# Patient Record
Sex: Male | Born: 1980 | Race: Black or African American | Hispanic: No | Marital: Single | Smoking: Former smoker
Health system: Southern US, Community
[De-identification: ages and names within clinical notes are randomized; demographics above are authoritative.]

---

## 2005-12-13 ENCOUNTER — Emergency Department (HOSPITAL_COMMUNITY): Admission: EM | Admit: 2005-12-13 | Discharge: 2005-12-13 | Payer: Self-pay | Admitting: Family Medicine

## 2006-01-07 ENCOUNTER — Emergency Department (HOSPITAL_COMMUNITY): Admission: EM | Admit: 2006-01-07 | Discharge: 2006-01-07 | Payer: Self-pay | Admitting: Family Medicine

## 2013-10-17 ENCOUNTER — Emergency Department (HOSPITAL_COMMUNITY)
Admission: EM | Admit: 2013-10-17 | Discharge: 2013-10-18 | Disposition: A | Discharge status: Home / Self Care | Attending: Emergency Medicine | Admitting: Emergency Medicine

## 2013-10-17 ENCOUNTER — Encounter (HOSPITAL_COMMUNITY): Payer: Self-pay | Admitting: Emergency Medicine

## 2013-10-17 DIAGNOSIS — R1013 Epigastric pain: Secondary | ICD-10-CM | POA: Insufficient documentation

## 2013-10-17 DIAGNOSIS — T7840XA Allergy, unspecified, initial encounter: Secondary | ICD-10-CM

## 2013-10-17 DIAGNOSIS — T61771A Other fish poisoning, accidental (unintentional), initial encounter: Secondary | ICD-10-CM | POA: Insufficient documentation

## 2013-10-17 DIAGNOSIS — Y9389 Activity, other specified: Secondary | ICD-10-CM | POA: Insufficient documentation

## 2013-10-17 DIAGNOSIS — Y929 Unspecified place or not applicable: Secondary | ICD-10-CM | POA: Insufficient documentation

## 2013-10-17 DIAGNOSIS — L272 Dermatitis due to ingested food: Secondary | ICD-10-CM | POA: Insufficient documentation

## 2013-10-17 DIAGNOSIS — R112 Nausea with vomiting, unspecified: Secondary | ICD-10-CM | POA: Insufficient documentation

## 2013-10-17 MED ORDER — METHYLPREDNISOLONE SODIUM SUCC 125 MG IJ SOLR
125.0000 mg | Freq: Once | INTRAMUSCULAR | Status: AC
  Administered 2013-10-17: 125 mg via INTRAVENOUS
  Filled 2013-10-17: qty 2

## 2013-10-17 NOTE — ED Notes (Signed)
States at present has some epigastric tenderness from vomiting prior to arrival

## 2013-10-17 NOTE — ED Notes (Signed)
Here with two attendants from [facility].  Patient has shackles on feet.   No urticaria noted at this time, no difficulty breathing.

## 2013-10-17 NOTE — ED Notes (Addendum)
States ate some food that another offered to him - was not aware it has oysters in it and he is allergic to shellfish.  EMS reports patient c/o urticaria but they did not see.  Also c/o feeling tightness in chest, given tagamet en route, patient also vomited and given zofran  Patient took Benadryl 50mg  po prior to EMS arrival - states the hives were under his arms at that time- none present now

## 2013-10-17 NOTE — ED Provider Notes (Signed)
CSN: [id number]     Arrival date & time 10/17/13  2249 History  This chart was scribed for Jonathon Nielsen, MD by Quintella Reichert, ED scribe.  This patient was seen in room APA14/APA14 and the patient's care was started at 11:08 PM.   Chief Complaint  Patient presents with  . Allergic Reaction    Patient is a 32 y.o. male presenting with allergic reaction. The history is provided by the patient. No language interpreter was used.  Allergic Reaction Presenting symptoms: difficulty breathing (throat tightness) and itching   Presenting symptoms comment:  Nausea, vomiting Severity:  Moderate Prior allergic episodes:  Animal allergies (shellfish) Context: food   Relieved by:  Antihistamines   HPI Comments: Jonathon Beasley is a 32 y.o. male who presents to the Emergency Department complaining of an allergic reaction.  Pt states that he "ate some fish I shouldn't have ate" tonight at around 5:30 PM and 3 hours later he developed mild-to-moderate throat tightness and generalized itching. PT states known allergy to shellfish and believes he consumed oysters. 2 hours ago he became nauseated and vomited.  He took 50mg  Benadryl PTA and was given IV Zantac and Zofran by EMS en route and he states his symptoms have since resolved.  He now denies any SOB or tongue swelling.  Pt admits to prior h/o similar reaction 3 days ago which resolved with Benadryl and he was not seen for this.  He denies prior similar episodes before that time.  Pt denies any other chronic medical conditions or regular medication usage.  EMS did report itching and hives - resolved in route     History reviewed. No pertinent past medical history.  History reviewed. No pertinent past surgical history.  No family history on file.   History  Substance Use Topics  . Smoking status: Never Smoker   . Smokeless tobacco: Not on file  . Alcohol Use: No    Review of Systems  Skin: Positive for itching.  All other systems reviewed and  are negative.      Allergies  Review of patient's allergies indicates no known allergies.  Home Medications  No current outpatient prescriptions on file.  BP 143/79  Pulse 85  Temp(Src) 98.9 F (37.2 C) (Oral)  Ht 6\' 1"  (1.854 m)  Wt 275 lb (124.739 kg)  BMI 36.29 kg/m2  SpO2 94%  Physical Exam  Nursing note and vitals reviewed. Constitutional: He is oriented to person, place, and time. He appears well-developed and well-nourished.  HENT:  Head: Normocephalic and atraumatic.  Mouth/Throat: Uvula is midline.  Eyes: EOM are normal.  Neck: Normal range of motion.  Cardiovascular: Normal rate, regular rhythm, normal heart sounds and intact distal pulses.   Pulmonary/Chest: Effort normal and breath sounds normal. No respiratory distress. He has no wheezes. He has no rales.  Abdominal: Soft. He exhibits no distension. There is tenderness (mild epigastric tenderness). There is negative Murphy's sign.  Musculoskeletal: Normal range of motion.  Neurological: He is alert and oriented to person, place, and time.  Skin: Skin is warm and dry. No rash noted.  Psychiatric: He has a normal mood and affect. Judgment normal.    ED Course  Procedures (including critical care time)  DIAGNOSTIC STUDIES: Oxygen Saturation is 94% on room air, adequate by my interpretation.    COORDINATION OF CARE: 11:13 PM: Discussed treatment plan which includes Solu-Medrol and labs.  Pt expressed understanding and agreed to plan.   Labs Review Labs Reviewed  CBC - Abnormal;  Notable for the following:    WBC 11.0 (*)    All other components within normal limits  COMPREHENSIVE METABOLIC PANEL  LIPASE, BLOOD   IV solumedrol  1:11 AM symptoms resolved, no acute ABD on exam: s/nt/nd.  Tolerating PO fluids.   Plan discharge back to facility with Rx prednisone, zantac and also benadryl as needed. Return precautions provided.  Stable for discharge at this time.   MDM  Dx: Allergic reaction, no  anaphylaxis  Labs reviewed Improved with medications Serial exams and evaluations VS and nurses notes reviewed and considered   I personally performed the services described in this documentation, which was scribed in my presence. The recorded information has been reviewed and is accurate.     Jonathon Nielsen, MD 10/18/13 [phone]

## 2013-10-18 LAB — COMPREHENSIVE METABOLIC PANEL
ALT: 18 U/L (ref 0–53)
Alkaline Phosphatase: 78 U/L (ref 39–117)
BUN: 8 mg/dL (ref 6–23)
CO2: 25 mEq/L (ref 19–32)
Chloride: 101 mEq/L (ref 96–112)
GFR calc Af Amer: >90 mL/min (ref >90)
Glucose, Bld: 99 mg/dL (ref 70–99)
Potassium: 3.6 mEq/L (ref 3.5–5.1)
Total Bilirubin: 0.4 mg/dL (ref 0.3–1.2)

## 2013-10-18 LAB — CBC
HCT: 40 % (ref 39.0–52.0)
Hemoglobin: 13 g/dL (ref 13.0–17.0)
RBC: 4.56 MIL/uL (ref 4.22–5.81)
WBC: 11 10*3/uL — ABNORMAL HIGH (ref 4.0–10.5)

## 2013-10-18 LAB — LIPASE, BLOOD: Lipase: 19 U/L (ref 11–59)

## 2013-10-18 MED ORDER — RANITIDINE HCL 150 MG PO CAPS: 150.0000 mg | ORAL_CAPSULE | Freq: Every day | ORAL | Status: DC

## 2013-10-18 MED ORDER — ONDANSETRON HCL 4 MG PO TABS: 4.0000 mg | ORAL_TABLET | Freq: Four times a day (QID) | ORAL | Status: DC

## 2013-10-18 MED ORDER — PREDNISONE 20 MG PO TABS: 60.0000 mg | ORAL_TABLET | Freq: Every day | ORAL | Status: DC

## 2013-10-18 NOTE — ED Notes (Signed)
Offered fluids - chose Sprite - drinking now.

## 2013-10-18 NOTE — ED Notes (Addendum)
Tolerated fluids without emesis or vomiting.  No urticaria.  No Wheezing and no respiratory distress

## 2014-02-03 ENCOUNTER — Ambulatory Visit (INDEPENDENT_AMBULATORY_CARE_PROVIDER_SITE_OTHER): Payer: BC Managed Care – PPO | Admitting: Sports Medicine

## 2014-02-03 ENCOUNTER — Encounter: Payer: Self-pay | Admitting: Sports Medicine

## 2014-02-03 VITALS — BP 127/80 | HR 81 | Ht 73.0 in | Wt 290.0 lb

## 2014-02-03 DIAGNOSIS — M25561 Pain in right knee: Secondary | ICD-10-CM | POA: Insufficient documentation

## 2014-02-03 DIAGNOSIS — M25569 Pain in unspecified knee: Secondary | ICD-10-CM

## 2014-02-03 MED ORDER — MELOXICAM 15 MG PO TABS: ORAL_TABLET | ORAL | Status: DC

## 2014-02-03 NOTE — Assessment & Plan Note (Signed)
This occurred after a slip and fall in the ice about a week ago, he had immediate pain, swelling, predominately along the medial joint line. Now he gets some clicking, I do suspect this is related to either medial meniscal tear versus patellofemoral excision. Knee brace, Mobic, formal physical therapy, out of work for one week.

## 2014-02-03 NOTE — Progress Notes (Signed)
   Subjective:    I'm seeing this patient as a consultation for:  Lindie SpruceBarnes, Daniel G Jr., MD   CC: Right knee pain  HPI: This is a pleasant 33 year old male, approximately 6 days ago he slipped and fell on the ice, he felt his knee cap popped, and had immediate pain and swelling, he was [facility] where x-rays showed patellofemoral DJD. He was placed in a knee immobilizer, given some oral medications, and told that he had a patellar dislocation. Since then he's noted occasional popping, under the patella but tells me the majority of his pain is along the medial joint line. It is difficult for him to bear weight at work.  Past medical history, Surgical history, Family history not pertinant except as noted below, Social history, Allergies, and medications have been entered into the medical record, reviewed, and no changes needed.   Review of Systems: No headache, visual changes, nausea, vomiting, diarrhea, constipation, dizziness, abdominal pain, skin rash, fevers, chills, night sweats, weight loss, swollen lymph nodes, body aches, joint swelling, muscle aches, chest pain, shortness of breath, mood changes, visual or auditory hallucinations.   Objective:   General: Well Developed, well nourished, and in no acute distress.  Neuro/Psych: Alert and oriented x3, extra-ocular muscles intact, able to move all 4 extremities, sensation grossly intact. Skin: Warm and dry, no rashes noted.  Respiratory: Not using accessory muscles, speaking in full sentences, trachea midline.  Cardiovascular: Pulses palpable, no extremity edema. Abdomen: Does not appear distended. Right Knee: Normal to inspection with no erythema or effusion or obvious bony abnormalities. Tender to palpation along the medial joint line, negative patellar apprehension sign. ROM full in flexion and extension and lower leg rotation. Ligaments with solid consistent endpoints including ACL,  PCL, LCL, MCL. Negative Mcmurray's, Apley's, and Thessalonian tests. Non painful patellar compression. Patellar glide without crepitus. Patellar and quadriceps tendons unremarkable. Hamstring and quadriceps strength is normal.  He was able to reproduce a pop, a did feel somewhat similar to a subluxing patella. I was not able to reproduce this manually.  Impression and Recommendations:   This case required medical decision making of moderate complexity.

## 2014-02-07 ENCOUNTER — Ambulatory Visit: Payer: BC Managed Care – PPO | Admitting: Physical Therapy

## 2014-02-09 ENCOUNTER — Ambulatory Visit: Payer: BC Managed Care – PPO | Admitting: Physical Therapy

## 2014-03-01 ENCOUNTER — Ambulatory Visit (INDEPENDENT_AMBULATORY_CARE_PROVIDER_SITE_OTHER): Payer: BC Managed Care – PPO

## 2014-03-01 ENCOUNTER — Encounter: Payer: Self-pay | Admitting: Sports Medicine

## 2014-03-01 ENCOUNTER — Ambulatory Visit (INDEPENDENT_AMBULATORY_CARE_PROVIDER_SITE_OTHER): Payer: BC Managed Care – PPO | Admitting: Sports Medicine

## 2014-03-01 VITALS — BP 141/84 | HR 89 | Ht 73.0 in | Wt 290.0 lb

## 2014-03-01 DIAGNOSIS — M25561 Pain in right knee: Secondary | ICD-10-CM

## 2014-03-01 DIAGNOSIS — M25569 Pain in unspecified knee: Secondary | ICD-10-CM

## 2014-03-01 DIAGNOSIS — M25559 Pain in unspecified hip: Secondary | ICD-10-CM

## 2014-03-01 DIAGNOSIS — R1031 Right lower quadrant pain: Secondary | ICD-10-CM

## 2014-03-01 DIAGNOSIS — Z299 Encounter for prophylactic measures, unspecified: Secondary | ICD-10-CM | POA: Insufficient documentation

## 2014-03-01 NOTE — Assessment & Plan Note (Signed)
Initial suspicion was patellar subluxation, versus medial meniscal tear. Now that his pain is 50% better, I am able to get a better exam, and unfortunately he does have a positive Lachman's test. We are going to obtain an MRI of the right knee, he come back to go over the results.

## 2014-03-01 NOTE — Assessment & Plan Note (Signed)
Patient does desire to establish care with me, he will come back for a complete physical at a later date.

## 2014-03-01 NOTE — Progress Notes (Signed)
  Subjective:    CC: Follow up and establish care  HPI: Right knee pain: 50% improved since the last month with relative rest. Unfortunately continues to have pain, instability, without mechanical symptoms.  Right groin pain: Moderate, persistent, present for a month, no injuries, worse with hip flexion.  Past medical history, Surgical history, Family history not pertinant except as noted below, Social history, Allergies, and medications have been entered into the medical record, reviewed, and no changes needed.   Review of Systems: No fevers, chills, night sweats, weight loss, chest pain, or shortness of breath.   Objective:    General: Well Developed, well nourished, and in no acute distress.  Neuro: Alert and oriented x3, extra-ocular muscles intact, sensation grossly intact.  HEENT: Normocephalic, atraumatic, pupils equal round reactive to light, neck supple, no masses, no lymphadenopathy, thyroid nonpalpable.  Skin: Warm and dry, no rashes. Cardiac: Regular rate and rhythm, no murmurs rubs or gallops, no lower extremity edema.  Respiratory: Clear to auscultation bilaterally. Not using accessory muscles, speaking in full sentences. Right Knee: Normal to inspection with no erythema or effusion or obvious bony abnormalities. Palpation normal with no warmth, joint line tenderness, patellar tenderness, or condyle tenderness. ROM full in flexion and extension and lower leg rotation. Anterior cruciate ligament feels loose with a positive Lachman's test. Negative Mcmurray's, Apley's, and Thessalonian tests. Non painful patellar compression. Patellar glide without crepitus. Patellar and quadriceps tendons unremarkable. Hamstring and quadriceps strength is normal.  Right Hip: ROM IR: 45 Deg, ER: 45 Deg, Flexion: 120 Deg, Extension: 100 Deg, Abduction: 45 Deg, Adduction: 45 Deg Strength IR: 5/5, ER: 5/5, Flexion: 5/5, Extension: 5/5, Abduction: 5/5, Adduction: 5/5 Pelvic alignment  unremarkable to inspection and palpation. Standing hip rotation and gait without trendelenburg sign / unsteadiness. Greater trochanter without tenderness to palpation. No tenderness over piriformis. No pain with FABER or FADIR. No SI joint tenderness and normal minimal SI movement. There is reproduction of pain with internal rotation of the right hip, as well as with resisted right hip flexion.  X-rays are unremarkable.  Impression and Recommendations:

## 2014-03-01 NOTE — Assessment & Plan Note (Signed)
Clinically this seems to represent a hip flexor strain. There are no mechanical symptoms to suggest an internal snapping hip. Certainly with his size, and again this could represent early osteoarthritis. Right hip x-rays, continue NSAIDs, hip rehabilitation exercises. Return in one month, MR arthrogram if no better.

## 2014-03-10 ENCOUNTER — Telehealth: Payer: Self-pay | Admitting: *Deleted

## 2014-03-10 NOTE — Telephone Encounter (Signed)
PA obtained for MRI RT Knee w/o contrast. Auth # [id number]. Exp. 04/08/14.  Meyer CoryMisty Ahmad, LPN

## 2014-03-23 ENCOUNTER — Ambulatory Visit (INDEPENDENT_AMBULATORY_CARE_PROVIDER_SITE_OTHER): Payer: BC Managed Care – PPO

## 2014-03-23 DIAGNOSIS — W19XXXA Unspecified fall, initial encounter: Secondary | ICD-10-CM

## 2014-03-23 DIAGNOSIS — M25561 Pain in right knee: Secondary | ICD-10-CM

## 2014-03-23 DIAGNOSIS — S83289A Other tear of lateral meniscus, current injury, unspecified knee, initial encounter: Secondary | ICD-10-CM

## 2014-03-28 ENCOUNTER — Encounter: Payer: BC Managed Care – PPO | Admitting: Sports Medicine

## 2014-03-28 DIAGNOSIS — Z0289 Encounter for other administrative examinations: Secondary | ICD-10-CM

## 2014-04-14 ENCOUNTER — Telehealth: Payer: Self-pay | Admitting: *Deleted

## 2014-04-14 ENCOUNTER — Ambulatory Visit (INDEPENDENT_AMBULATORY_CARE_PROVIDER_SITE_OTHER): Payer: BC Managed Care – PPO | Admitting: Sports Medicine

## 2014-04-14 ENCOUNTER — Encounter: Payer: Self-pay | Admitting: Sports Medicine

## 2014-04-14 ENCOUNTER — Other Ambulatory Visit: Payer: BC Managed Care – PPO

## 2014-04-14 VITALS — BP 139/90 | HR 64 | Ht 73.0 in | Wt 288.0 lb

## 2014-04-14 DIAGNOSIS — Z299 Encounter for prophylactic measures, unspecified: Secondary | ICD-10-CM

## 2014-04-14 DIAGNOSIS — M25561 Pain in right knee: Secondary | ICD-10-CM

## 2014-04-14 DIAGNOSIS — R1031 Right lower quadrant pain: Secondary | ICD-10-CM

## 2014-04-14 DIAGNOSIS — M25569 Pain in unspecified knee: Secondary | ICD-10-CM

## 2014-04-14 MED ORDER — MELOXICAM 15 MG PO TABS: ORAL_TABLET | ORAL | Status: DC

## 2014-04-14 NOTE — Progress Notes (Signed)
  Subjective:    CC: Complete physical  HPI:  Preventive measure: Complete physical performed today, blood work will be ordered.  Right-sided groin pain: Initially suspected to be a hip flexor strain, continued to have pain but he localizes in the groin radiating to the scrotum. It's worse with straining and lifting heavy things. No changes in bowel function.  Right knee pain: MRI did confirm multiple injuries to the menisci, pain is essentially resolved right now.  Past medical history, Surgical history, Family history not pertinant except as noted below, Social history, Allergies, and medications have been entered into the medical record, reviewed, and no changes needed.   Review of Systems: No headache, visual changes, nausea, vomiting, diarrhea, constipation, dizziness, abdominal pain, skin rash, fevers, chills, night sweats, swollen lymph nodes, weight loss, chest pain, body aches, joint swelling, muscle aches, shortness of breath, mood changes, visual or auditory hallucinations.  Objective:    General: Well Developed, well nourished, and in no acute distress.  Neuro: Alert and oriented x3, extra-ocular muscles intact, sensation grossly intact.  HEENT: Normocephalic, atraumatic, pupils equal round reactive to light, neck supple, no masses, no lymphadenopathy, thyroid nonpalpable.  Skin: Warm and dry, no rashes noted.  Cardiac: Regular rate and rhythm, no murmurs rubs or gallops.  Respiratory: Clear to auscultation bilaterally. Not using accessory muscles, speaking in full sentences.  Abdominal: Soft, nontender, nondistended, positive bowel sounds, no masses, no organomegaly.  Musculoskeletal: Shoulder, elbow, wrist, hip, knee, ankle stable, and with full range of motion. No reproduction of hip/groin pain with resisted hip flexion on the right. Genitalia: Normal in appearance, no discharge, no testicular masses, no palpable protrusions with Valsalva in the external or internal inguinal  ring, he does have a palpable hernia with Valsalva on the anterior lower abdominal wall on the right.  Impression and Recommendations:    The patient was counselled, risk factors were discussed, anticipatory guidance given.

## 2014-04-14 NOTE — Assessment & Plan Note (Signed)
MRI showed fairly extensive meniscal tears however his pain is essentially resolved. We will keep an eye on this.

## 2014-04-14 NOTE — Assessment & Plan Note (Signed)
Complete physical today. Checking routine blood work.

## 2014-04-14 NOTE — Assessment & Plan Note (Signed)
Initially thought to represent a hip flexor strain. Today on exam I am able to palpate what feels like a direct inguinal hernia that reproduces his pain. CT of the abdomen and pelvis with oral and IV contrast, and referral to Gen. surgery for consideration of repair.

## 2014-04-14 NOTE — Telephone Encounter (Signed)
PA obtained for CT ABD/Pelvis w/contrast.  Auth # [id number].  Meyer CoryMisty Othel Hoogendoorn, LPN

## 2014-04-15 ENCOUNTER — Ambulatory Visit (INDEPENDENT_AMBULATORY_CARE_PROVIDER_SITE_OTHER): Payer: BC Managed Care – PPO

## 2014-04-15 DIAGNOSIS — R1031 Right lower quadrant pain: Secondary | ICD-10-CM

## 2014-04-15 LAB — COMPREHENSIVE METABOLIC PANEL WITH GFR
ALT: 23 U/L (ref 0–53)
CO2: 23 meq/L (ref 19–32)
Calcium: 9.5 mg/dL (ref 8.4–10.5)
Chloride: 104 meq/L (ref 96–112)
Creat: 1.04 mg/dL (ref 0.50–1.35)
Glucose, Bld: 85 mg/dL (ref 70–99)

## 2014-04-15 LAB — LIPID PANEL
Cholesterol: 182 mg/dL (ref 0–200)
HDL: 63 mg/dL (ref >39)
LDL Cholesterol: 100 mg/dL — ABNORMAL HIGH (ref 0–99)
Total CHOL/HDL Ratio: 2.9 Ratio
Triglycerides: 94 mg/dL (ref <150)
VLDL: 19 mg/dL (ref 0–40)

## 2014-04-15 LAB — TSH: TSH: 0.619 u[IU]/mL (ref 0.350–4.500)

## 2014-04-15 LAB — CBC
HCT: 41.4 % (ref 39.0–52.0)
Hemoglobin: 13.8 g/dL (ref 13.0–17.0)
MCH: 28.5 pg (ref 26.0–34.0)
MCHC: 33.3 g/dL (ref 30.0–36.0)
MCV: 85.5 fL (ref 78.0–100.0)
Platelets: 200 K/uL (ref 150–400)
RBC: 4.84 MIL/uL (ref 4.22–5.81)
RDW: 13.3 % (ref 11.5–15.5)
WBC: 5.6 K/uL (ref 4.0–10.5)

## 2014-04-15 LAB — COMPREHENSIVE METABOLIC PANEL
AST: 20 U/L (ref 0–37)
Albumin: 4.3 g/dL (ref 3.5–5.2)
Alkaline Phosphatase: 73 U/L (ref 39–117)
BUN: 9 mg/dL (ref 6–23)
Potassium: 4.2 mEq/L (ref 3.5–5.3)
Sodium: 137 mEq/L (ref 135–145)
Total Bilirubin: 0.5 mg/dL (ref 0.2–1.2)
Total Protein: 7.3 g/dL (ref 6.0–8.3)

## 2014-04-15 LAB — HEMOGLOBIN A1C
Hgb A1c MFr Bld: 5.9 % — ABNORMAL HIGH (ref <5.7)
Mean Plasma Glucose: 123 mg/dL — ABNORMAL HIGH (ref <117)

## 2014-04-15 MED ORDER — IOHEXOL 300 MG/ML  SOLN
100.0000 mL | Freq: Once | INTRAMUSCULAR | Status: AC | PRN
  Administered 2014-04-15: 100 mL via INTRAVENOUS

## 2014-04-22 ENCOUNTER — Telehealth: Payer: Self-pay

## 2014-04-22 MED ORDER — TRAMADOL HCL 50 MG PO TABS: ORAL_TABLET | ORAL | Status: DC

## 2014-04-22 NOTE — Telephone Encounter (Signed)
Patient advised.

## 2014-04-22 NOTE — Telephone Encounter (Signed)
Switching to tramadol, did he ever see the surgeon? Prescription is in my box.

## 2014-04-22 NOTE — Telephone Encounter (Signed)
Jonathon Beasley complains he is still has groin and leg pain. The meloxicam is not helping with the pain. He reports his pain level is still at 8/10. Please advise.

## 2014-04-29 ENCOUNTER — Ambulatory Visit (INDEPENDENT_AMBULATORY_CARE_PROVIDER_SITE_OTHER): Payer: BC Managed Care – PPO | Admitting: Surgery

## 2014-07-22 ENCOUNTER — Ambulatory Visit: Payer: BC Managed Care – PPO | Admitting: Sports Medicine

## 2014-07-22 DIAGNOSIS — Z0289 Encounter for other administrative examinations: Secondary | ICD-10-CM

## 2019-05-19 ENCOUNTER — Encounter: Payer: Self-pay | Admitting: Sports Medicine

## 2019-05-19 ENCOUNTER — Ambulatory Visit (INDEPENDENT_AMBULATORY_CARE_PROVIDER_SITE_OTHER): Payer: Self-pay | Admitting: Sports Medicine

## 2019-05-19 DIAGNOSIS — S83005S Unspecified dislocation of left patella, sequela: Secondary | ICD-10-CM

## 2019-05-19 DIAGNOSIS — S83105A Unspecified dislocation of left knee, initial encounter: Secondary | ICD-10-CM

## 2019-05-19 MED ORDER — CYCLOBENZAPRINE HCL 10 MG PO TABS: ORAL_TABLET | ORAL | 0 refills | Status: AC

## 2019-05-19 MED ORDER — MELOXICAM 15 MG PO TABS: ORAL_TABLET | ORAL | 3 refills | Status: DC

## 2019-05-19 MED ORDER — HYDROCODONE-ACETAMINOPHEN 10-325 MG PO TABS: 1.0000 | ORAL_TABLET | Freq: Two times a day (BID) | ORAL | 0 refills | Status: DC | PRN

## 2019-05-19 NOTE — Assessment & Plan Note (Addendum)
Left patellar dislocation 2 months ago, reducible but luxes again with knee flexion. Patella was reduced and knee placed in an immobilizer, high-dose hydrocodone, Flexeril at bedtime. MRI, I discussed this with Dr. Ophelia Charter, I would like him to see the patient on Monday. This will need operative intervention.

## 2019-05-19 NOTE — Progress Notes (Signed)
Subjective:    CC: Left knee injury  HPI:  This is a pleasant 38 year old male, 2 months ago he was wrestling with his girlfriend, she fell on his knee dislocating his patella.  He sought medical care in the emergency department, x-rays were normal, due to insurance issues he never sought additional medical care.  His kneecap continue to luxate laterally, to the point where it would not reduce and he was unable to extend his knee.  Pain is severe, persistent, localized without radiation.  I reviewed the past medical history, family history, social history, surgical history, and allergies today and no changes were needed.  Please see the problem list section below in epic for further details.  Past Medical History: No past medical history on file. Past Surgical History: No past surgical history on file. Social History: Social History   Socioeconomic History  . Marital status: Single    Spouse name: Not on file  . Number of children: Not on file  . Years of education: Not on file  . Highest education level: Not on file  Occupational History  . Not on file  Social Needs  . Financial resource strain: Not on file  . Food insecurity    Worry: Not on file    Inability: Not on file  . Transportation needs    Medical: Not on file    Non-medical: Not on file  Tobacco Use  . Smoking status: Never Smoker  . Smokeless tobacco: Never Used  Substance and Sexual Activity  . Alcohol use: No  . Drug use: Not on file  . Sexual activity: Not on file  Lifestyle  . Physical activity    Days per week: Not on file    Minutes per session: Not on file  . Stress: Not on file  Relationships  . Social Musicianconnections    Talks on phone: Not on file    Gets together: Not on file    Attends religious service: Not on file    Active member of club or organization: Not on file    Attends meetings of clubs or organizations: Not on file    Relationship status: Not on file  Other Topics Concern  . Not  on file  Social History Narrative  . Not on file   Family History: No family history on file. Allergies: Allergies  Allergen Reactions  . Shellfish Allergy    Medications: See med rec.  Review of Systems: No headache, visual changes, nausea, vomiting, diarrhea, constipation, dizziness, abdominal pain, skin rash, fevers, chills, night sweats, swollen lymph nodes, weight loss, chest pain, body aches, joint swelling, muscle aches, shortness of breath, mood changes, visual or auditory hallucinations.  Objective:    General: Well Developed, well nourished, and in no acute distress.  Neuro: Alert and oriented x3, extra-ocular muscles intact, sensation grossly intact.  HEENT: Normocephalic, atraumatic, pupils equal round reactive to light, neck supple, no masses, no lymphadenopathy, thyroid nonpalpable.  Skin: Warm and dry, no rashes noted.  Cardiac: Regular rate and rhythm, no murmurs rubs or gallops.  Respiratory: Clear to auscultation bilaterally. Not using accessory muscles, speaking in full sentences.  Abdominal: Soft, nontender, nondistended, positive bowel sounds, no masses, no organomegaly.  Left knee: Swollen, patella is dislocated laterally.  It is reducible with his knee extended however it immediately luxates again with flexion of the knee.  I was able to maintain reduction of the patella with application of a medially directed force over the patella and with the knee  locked in an immobilizer.  Knee x-rays are unremarkable.  Impression and Recommendations:    The patient was counselled, risk factors were discussed, anticipatory guidance given.  Patellar dislocation, left, sequela Left patellar dislocation 2 months ago, reducible but luxes again with knee flexion. Patella was reduced and knee placed in an immobilizer, high-dose hydrocodone, Flexeril at bedtime. MRI, I discussed this with Dr. Ophelia Charter, I would like him to see the patient on Monday. This will need operative  intervention.   ___________________________________________ Gwen Her. Dianah Field, M.D., ABFM., CAQSM. Primary Care and Sports Medicine [facility]  Adjunct Professor of [employer]

## 2019-05-23 ENCOUNTER — Other Ambulatory Visit: Payer: Self-pay

## 2019-05-25 ENCOUNTER — Ambulatory Visit (INDEPENDENT_AMBULATORY_CARE_PROVIDER_SITE_OTHER): Payer: Self-pay | Admitting: Orthopaedic Surgery

## 2019-05-25 ENCOUNTER — Other Ambulatory Visit: Payer: Self-pay

## 2019-05-25 ENCOUNTER — Ambulatory Visit (INDEPENDENT_AMBULATORY_CARE_PROVIDER_SITE_OTHER): Payer: Self-pay

## 2019-05-25 DIAGNOSIS — M25562 Pain in left knee: Secondary | ICD-10-CM

## 2019-05-25 DIAGNOSIS — S83005S Unspecified dislocation of left patella, sequela: Secondary | ICD-10-CM

## 2019-05-25 NOTE — Progress Notes (Signed)
Office Visit Note   Patient: Jonathon Beasley           Date of Birth: 03/25/1981           MRN: [id number] Visit Date: 05/25/2019              Requested by: Monica Bectonhekkekandam, Thomas J, MD [address] [place] [id number] PCP: Patient, No Pcp Per   Assessment & Plan: Visit Diagnoses:  1. Patellar dislocation, left, sequela   2. 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  At this point we will keep the knee in full extension in a knee immobilizer which he states he has at home.  I encouraged him to be fully compliant with this.  We will reorder the MRI to evaluate for structural abnormalities and for surgical planning.  We will see the patient back after the MRI.  Follow-Up Instructions: Return in about 10 days (around 06/04/2019) for to review MRI.   Orders:  Orders Placed This Encounter  Procedures  . XR Knee 1-2 Views Left  . MR Knee Left w/o contrast   No orders of the defined types were placed in this encounter.     Procedures: No procedures performed   Clinical Data: No additional findings.   Subjective: Chief Complaint  Patient presents with  . Left Knee - Injury, Pain    Gregary SignsSean is a 38 year old gentleman who comes in for evaluation of a chronically subluxating left patella.  He originally sustained this injury in April when he was wrestling with a friend of his.  [facility] and x-rays were reportedly normal.  He states that he is continued to have severe pain and dislocating patella and inability to extend his knee.  He was seen by [facility]. Karie Schwalbe at the [facility] and MRI was ordered but was subsequently denied by his insurance.  He comes in today for evaluation.   Review of Systems  Constitutional: Negative.   All other systems reviewed and are negative.    Objective: Vital Signs: There were no vitals  taken for this visit.  Physical Exam Vitals signs and nursing note reviewed.  Constitutional:      Appearance: He is well-developed.  HENT:     Head: Normocephalic and atraumatic.  Eyes:     Pupils: Pupils are equal, round, and reactive to light.  Neck:     Musculoskeletal: Neck supple.  Pulmonary:     Effort: Pulmonary effort is normal.  Abdominal:     Palpations: Abdomen is soft.  Musculoskeletal: Normal range of motion.  Skin:    General: Skin is warm.  Neurological:     Mental Status: He is alert and oriented to person, place, and time.  Psychiatric:        Behavior: Behavior normal.        Thought Content: Thought content normal.        Judgment: Judgment normal.     Ortho Exam Left knee exam shows a reducible lateral patellar dislocation with knee extension.  The patella dislocates laterally beyond 35 degrees of flexion.  He has a joint effusion.  Collaterals and cruciates are stable.  He is able to maintain a straight leg but is unable to actively extend his knee from a flexed position. Specialty Comments:  No specialty comments available.  Imaging: Xr Knee 1-2 Views  Left  Result Date: 05/25/2019 Mild degenerative changes.  Laterally dislocated patella on the sunrise view and lateral view.    PMFS History: Patient Active Problem List   Diagnosis Date Noted  . Patellar dislocation, left, sequela 05/19/2019  . Rt groin pain 03/01/2014  . Preventive measure 03/01/2014  . Knee pain, right 02/03/2014   No past medical history on file.  No family history on file.  No past surgical history on file. Social History   Occupational History  . Not on file  Tobacco Use  . Smoking status: Never Smoker  . Smokeless tobacco: Never Used  Substance and Sexual Activity  . Alcohol use: No  . Drug use: Not on file  . Sexual activity: Not on file

## 2019-06-06 ENCOUNTER — Other Ambulatory Visit: Payer: Self-pay

## 2019-06-06 ENCOUNTER — Ambulatory Visit (INDEPENDENT_AMBULATORY_CARE_PROVIDER_SITE_OTHER): Payer: Self-pay

## 2019-06-06 DIAGNOSIS — M25562 Pain in left knee: Secondary | ICD-10-CM

## 2019-06-06 DIAGNOSIS — S83005S Unspecified dislocation of left patella, sequela: Secondary | ICD-10-CM

## 2019-06-08 ENCOUNTER — Encounter: Payer: Self-pay | Admitting: Orthopaedic Surgery

## 2019-06-08 ENCOUNTER — Other Ambulatory Visit: Payer: Self-pay

## 2019-06-08 ENCOUNTER — Ambulatory Visit (INDEPENDENT_AMBULATORY_CARE_PROVIDER_SITE_OTHER): Payer: Self-pay | Admitting: Orthopaedic Surgery

## 2019-06-08 DIAGNOSIS — S83005S Unspecified dislocation of left patella, sequela: Secondary | ICD-10-CM

## 2019-06-08 NOTE — Progress Notes (Signed)
   Office Visit Note   Patient: Jonathon Beasley           Date of Birth: [birth year]           MRN: [id number] Visit Date: 06/08/2019              Requested by: No referring provider defined for this encounter. PCP: Patient, No Pcp Per   Assessment & Plan: Visit Diagnoses:  1. 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  There is MPFL and medial retinaculum are both attenuated and torn.  At this point we will move forward with scheduling surgery for repair of the medial retinaculum and reconstruction of the MPFL.  Risks and benefits and postoperative rehab were reviewed with the patient.  Patient agrees to proceed with surgery.  Follow-Up Instructions: Return if symptoms worsen or fail to improve.   Orders:  No orders of the defined types were placed in this encounter.  No orders of the defined types were placed in this encounter.     Procedures: No procedures performed   Clinical Data: No additional findings.   Subjective: Chief Complaint  Patient presents with  . Left Knee - Follow-up    Jonathon Beasley returns today for review of his left MRI.  He has had trouble wearing the knee immobilizer.   Review of Systems   Objective: Vital Signs: There were no vitals taken for this visit.  Physical Exam  Ortho Exam Left knee exam is unchanged.  He has a chronically laterally dislocated patella. Specialty Comments:  No specialty comments available.  Imaging: No results found.   PMFS History: Patient Active Problem List   Diagnosis Date Noted  . Patellar dislocation, left, sequela 05/19/2019  . Rt groin pain 03/01/2014  . Preventive measure 03/01/2014  . Knee pain, right 02/03/2014   History reviewed. No pertinent past medical history.  History reviewed. No pertinent family history.  History reviewed. No pertinent surgical history. Social History   Occupational History  . Not  on file  Tobacco Use  . Smoking status: Never Smoker  . Smokeless tobacco: Never Used  Substance and Sexual Activity  . Alcohol use: No  . Drug use: Not on file  . Sexual activity: Not on file

## 2019-06-11 ENCOUNTER — Other Ambulatory Visit: Payer: Self-pay

## 2019-06-11 ENCOUNTER — Encounter (HOSPITAL_BASED_OUTPATIENT_CLINIC_OR_DEPARTMENT_OTHER): Payer: Self-pay | Admitting: *Deleted

## 2019-06-15 ENCOUNTER — Other Ambulatory Visit (HOSPITAL_COMMUNITY)
Admission: RE | Admit: 2019-06-15 | Discharge: 2019-06-15 | Disposition: A | Discharge status: Home / Self Care | Payer: Medicaid Other | Source: Ambulatory Visit | Attending: Orthopaedic Surgery | Admitting: Orthopaedic Surgery

## 2019-06-15 ENCOUNTER — Other Ambulatory Visit (HOSPITAL_COMMUNITY)
Admission: RE | Admit: 2019-06-15 | Discharge: 2019-06-15 | Disposition: A | Discharge status: Home / Self Care | Payer: Self-pay | Source: Ambulatory Visit | Attending: Orthopaedic Surgery | Admitting: Orthopaedic Surgery

## 2019-06-15 ENCOUNTER — Other Ambulatory Visit: Payer: Self-pay

## 2019-06-15 ENCOUNTER — Encounter (HOSPITAL_BASED_OUTPATIENT_CLINIC_OR_DEPARTMENT_OTHER)
Admission: RE | Admit: 2019-06-15 | Discharge: 2019-06-15 | Disposition: A | Discharge status: Home / Self Care | Payer: Medicaid Other | Source: Ambulatory Visit | Attending: Orthopaedic Surgery | Admitting: Orthopaedic Surgery

## 2019-06-15 DIAGNOSIS — Z1159 Encounter for screening for other viral diseases: Secondary | ICD-10-CM | POA: Insufficient documentation

## 2019-06-15 DIAGNOSIS — Z01812 Encounter for preprocedural laboratory examination: Secondary | ICD-10-CM | POA: Insufficient documentation

## 2019-06-15 LAB — BASIC METABOLIC PANEL
Anion gap: 13 (ref 5–15)
BUN: 10 mg/dL (ref 6–20)
CO2: 23 mmol/L (ref 22–32)
Calcium: 9.8 mg/dL (ref 8.9–10.3)
Chloride: 101 mmol/L (ref 98–111)
Creatinine, Ser: 1.23 mg/dL (ref 0.61–1.24)
GFR calc Af Amer: >60 mL/min (ref >60)
GFR calc non Af Amer: >60 mL/min (ref >60)
Glucose, Bld: 100 mg/dL — ABNORMAL HIGH (ref 70–99)
Potassium: 4.4 mmol/L (ref 3.5–5.1)
Sodium: 137 mmol/L (ref 135–145)

## 2019-06-15 LAB — SARS CORONAVIRUS 2 (TAT 6-24 HRS): SARS Coronavirus 2: NEGATIVE

## 2019-06-15 NOTE — Progress Notes (Signed)
Ensure pre surgical drink given to pt with instructions to finish by 0415 DOS. Pt verbalized understanding 

## 2019-06-18 ENCOUNTER — Other Ambulatory Visit: Payer: Self-pay

## 2019-06-18 ENCOUNTER — Ambulatory Visit (HOSPITAL_BASED_OUTPATIENT_CLINIC_OR_DEPARTMENT_OTHER): Payer: Self-pay | Admitting: Anesthesiology

## 2019-06-18 ENCOUNTER — Ambulatory Visit (HOSPITAL_BASED_OUTPATIENT_CLINIC_OR_DEPARTMENT_OTHER)
Admission: RE | Admit: 2019-06-18 | Discharge: 2019-06-18 | Disposition: A | Discharge status: Home / Self Care | Payer: Self-pay | Attending: Orthopaedic Surgery | Admitting: Orthopaedic Surgery

## 2019-06-18 ENCOUNTER — Ambulatory Visit (HOSPITAL_COMMUNITY): Payer: Self-pay

## 2019-06-18 ENCOUNTER — Encounter (HOSPITAL_BASED_OUTPATIENT_CLINIC_OR_DEPARTMENT_OTHER): Payer: Self-pay | Admitting: *Deleted

## 2019-06-18 ENCOUNTER — Encounter (HOSPITAL_BASED_OUTPATIENT_CLINIC_OR_DEPARTMENT_OTHER)
Admission: RE | Disposition: A | Discharge status: Home / Self Care | Payer: Self-pay | Source: Home / Self Care | Attending: Orthopaedic Surgery

## 2019-06-18 DIAGNOSIS — Z79899 Other long term (current) drug therapy: Secondary | ICD-10-CM | POA: Insufficient documentation

## 2019-06-18 DIAGNOSIS — S83005S Unspecified dislocation of left patella, sequela: Secondary | ICD-10-CM

## 2019-06-18 DIAGNOSIS — M2202 Recurrent dislocation of patella, left knee: Secondary | ICD-10-CM | POA: Insufficient documentation

## 2019-06-18 DIAGNOSIS — M238X2 Other internal derangements of left knee: Secondary | ICD-10-CM | POA: Insufficient documentation

## 2019-06-18 DIAGNOSIS — S83005A Unspecified dislocation of left patella, initial encounter: Secondary | ICD-10-CM

## 2019-06-18 DIAGNOSIS — S83005D Unspecified dislocation of left patella, subsequent encounter: Secondary | ICD-10-CM

## 2019-06-18 DIAGNOSIS — M2242 Chondromalacia patellae, left knee: Secondary | ICD-10-CM | POA: Insufficient documentation

## 2019-06-18 DIAGNOSIS — Z87891 Personal history of nicotine dependence: Secondary | ICD-10-CM | POA: Insufficient documentation

## 2019-06-18 HISTORY — PX: MEDIAL PATELLOFEMORAL LIGAMENT REPAIR: SHX2020

## 2019-06-18 HISTORY — PX: PATELLAR TENDON REPAIR: SHX737

## 2019-06-18 SURGERY — REPAIR, TENDON, PATELLAR
Anesthesia: General | Site: Knee | Laterality: Left

## 2019-06-18 MED ORDER — ESMOLOL HCL 100 MG/10ML IV SOLN
INTRAVENOUS | Status: DC | PRN
  Administered 2019-06-18: 10 mg via INTRAVENOUS

## 2019-06-18 MED ORDER — CEFAZOLIN SODIUM-DEXTROSE 2-4 GM/100ML-% IV SOLN
INTRAVENOUS | Status: AC
  Filled 2019-06-18: qty 100

## 2019-06-18 MED ORDER — FENTANYL CITRATE (PF) 100 MCG/2ML IJ SOLN
INTRAMUSCULAR | Status: AC
  Filled 2019-06-18: qty 2

## 2019-06-18 MED ORDER — MIDAZOLAM HCL 2 MG/2ML IJ SOLN
INTRAMUSCULAR | Status: AC
  Filled 2019-06-18: qty 2

## 2019-06-18 MED ORDER — PROPOFOL 10 MG/ML IV BOLUS
INTRAVENOUS | Status: AC
  Filled 2019-06-18: qty 20

## 2019-06-18 MED ORDER — OXYCODONE-ACETAMINOPHEN 5-325 MG PO TABS: 1.0000 | ORAL_TABLET | Freq: Three times a day (TID) | ORAL | 0 refills | Status: DC | PRN

## 2019-06-18 MED ORDER — PHENYLEPHRINE HCL (PRESSORS) 10 MG/ML IV SOLN
INTRAVENOUS | Status: AC
  Filled 2019-06-18: qty 1

## 2019-06-18 MED ORDER — FENTANYL CITRATE (PF) 100 MCG/2ML IJ SOLN
INTRAMUSCULAR | Status: DC | PRN
  Administered 2019-06-18: 25 ug via INTRAVENOUS

## 2019-06-18 MED ORDER — CLONIDINE HCL (ANALGESIA) 100 MCG/ML EP SOLN
EPIDURAL | Status: DC | PRN
  Administered 2019-06-18: 70 ug

## 2019-06-18 MED ORDER — LACTATED RINGERS IV SOLN
INTRAVENOUS | Status: DC
  Administered 2019-06-18: 11:00:00 via INTRAVENOUS

## 2019-06-18 MED ORDER — PROPOFOL 10 MG/ML IV BOLUS
INTRAVENOUS | Status: DC | PRN
  Administered 2019-06-18: 50 mg via INTRAVENOUS
  Administered 2019-06-18: 120 mg via INTRAVENOUS
  Administered 2019-06-18: 150 mg via INTRAVENOUS

## 2019-06-18 MED ORDER — LACTATED RINGERS IV SOLN
INTRAVENOUS | Status: DC
  Administered 2019-06-18 (×2): via INTRAVENOUS

## 2019-06-18 MED ORDER — ESMOLOL HCL 100 MG/10ML IV SOLN
INTRAVENOUS | Status: AC
  Filled 2019-06-18: qty 10

## 2019-06-18 MED ORDER — SODIUM CHLORIDE 0.9 % IV SOLN
INTRAVENOUS | Status: DC | PRN
  Administered 2019-06-18: 50 ug/min via INTRAVENOUS

## 2019-06-18 MED ORDER — KETOROLAC TROMETHAMINE 30 MG/ML IJ SOLN
30.0000 mg | Freq: Once | INTRAMUSCULAR | Status: AC | PRN
  Administered 2019-06-18: 30 mg via INTRAVENOUS

## 2019-06-18 MED ORDER — KETOROLAC TROMETHAMINE 30 MG/ML IJ SOLN
INTRAMUSCULAR | Status: AC
  Filled 2019-06-18: qty 1

## 2019-06-18 MED ORDER — CEFAZOLIN SODIUM-DEXTROSE 1-4 GM/50ML-% IV SOLN
INTRAVENOUS | Status: AC
  Filled 2019-06-18: qty 50

## 2019-06-18 MED ORDER — SUCCINYLCHOLINE CHLORIDE 200 MG/10ML IV SOSY
PREFILLED_SYRINGE | INTRAVENOUS | Status: AC
  Filled 2019-06-18: qty 30

## 2019-06-18 MED ORDER — PHENYLEPHRINE HCL (PRESSORS) 10 MG/ML IV SOLN
INTRAVENOUS | Status: DC | PRN
  Administered 2019-06-18 (×4): 80 ug via INTRAVENOUS

## 2019-06-18 MED ORDER — CHLORHEXIDINE GLUCONATE 4 % EX LIQD: 60.0000 mL | Freq: Once | CUTANEOUS | Status: DC

## 2019-06-18 MED ORDER — ONDANSETRON HCL 4 MG/2ML IJ SOLN
INTRAMUSCULAR | Status: AC
  Filled 2019-06-18: qty 2

## 2019-06-18 MED ORDER — OXYCODONE HCL 5 MG PO TABS
ORAL_TABLET | ORAL | Status: AC
  Filled 2019-06-18: qty 1

## 2019-06-18 MED ORDER — HYDROMORPHONE HCL 1 MG/ML IJ SOLN: 0.2500 mg | INTRAMUSCULAR | Status: DC | PRN

## 2019-06-18 MED ORDER — PROMETHAZINE HCL 25 MG PO TABS: 25.0000 mg | ORAL_TABLET | Freq: Four times a day (QID) | ORAL | 1 refills | Status: AC | PRN

## 2019-06-18 MED ORDER — MIDAZOLAM HCL 2 MG/2ML IJ SOLN
1.0000 mg | INTRAMUSCULAR | Status: DC | PRN
  Administered 2019-06-18: 07:00:00 2 mg via INTRAVENOUS

## 2019-06-18 MED ORDER — LIDOCAINE 2% (20 MG/ML) 5 ML SYRINGE
INTRAMUSCULAR | Status: DC | PRN
  Administered 2019-06-18: 100 mg via INTRAVENOUS

## 2019-06-18 MED ORDER — DEXTROSE 5 % IV SOLN
3.0000 g | INTRAVENOUS | Status: AC
  Administered 2019-06-18: 07:00:00 3 g via INTRAVENOUS

## 2019-06-18 MED ORDER — FENTANYL CITRATE (PF) 100 MCG/2ML IJ SOLN
50.0000 ug | INTRAMUSCULAR | Status: DC | PRN
  Administered 2019-06-18: 100 ug via INTRAVENOUS

## 2019-06-18 MED ORDER — MIDAZOLAM HCL 5 MG/5ML IJ SOLN
INTRAMUSCULAR | Status: DC | PRN
  Administered 2019-06-18: 2 mg via INTRAVENOUS

## 2019-06-18 MED ORDER — BUPIVACAINE-EPINEPHRINE (PF) 0.5% -1:200000 IJ SOLN
INTRAMUSCULAR | Status: DC | PRN
  Administered 2019-06-18: 30 mL via PERINEURAL

## 2019-06-18 MED ORDER — ONDANSETRON HCL 4 MG/2ML IJ SOLN
INTRAMUSCULAR | Status: DC | PRN
  Administered 2019-06-18: 4 mg via INTRAVENOUS

## 2019-06-18 MED ORDER — METHOCARBAMOL 750 MG PO TABS: 750.0000 mg | ORAL_TABLET | Freq: Two times a day (BID) | ORAL | 0 refills | Status: AC | PRN

## 2019-06-18 MED ORDER — PROMETHAZINE HCL 25 MG/ML IJ SOLN: 6.2500 mg | INTRAMUSCULAR | Status: DC | PRN

## 2019-06-18 MED ORDER — DEXAMETHASONE SODIUM PHOSPHATE 4 MG/ML IJ SOLN
INTRAMUSCULAR | Status: DC | PRN
Start: 2019-06-18 — End: 2019-06-18
  Administered 2019-06-18: 10 mg via INTRAVENOUS

## 2019-06-18 MED ORDER — SUCCINYLCHOLINE CHLORIDE 20 MG/ML IJ SOLN
INTRAMUSCULAR | Status: DC | PRN
  Administered 2019-06-18: 50 mg via INTRAVENOUS

## 2019-06-18 MED ORDER — SCOPOLAMINE 1 MG/3DAYS TD PT72: 1.0000 | MEDICATED_PATCH | Freq: Once | TRANSDERMAL | Status: DC

## 2019-06-18 MED ORDER — OXYCODONE HCL 5 MG PO TABS
5.0000 mg | ORAL_TABLET | Freq: Once | ORAL | Status: AC | PRN
  Administered 2019-06-18: 11:00:00 5 mg via ORAL

## 2019-06-18 MED ORDER — DEXAMETHASONE SODIUM PHOSPHATE 10 MG/ML IJ SOLN
INTRAMUSCULAR | Status: AC
  Filled 2019-06-18: qty 1

## 2019-06-18 MED ORDER — LIDOCAINE 2% (20 MG/ML) 5 ML SYRINGE
INTRAMUSCULAR | Status: AC
  Filled 2019-06-18: qty 5

## 2019-06-18 MED ORDER — PHENYLEPHRINE 40 MCG/ML (10ML) SYRINGE FOR IV PUSH (FOR BLOOD PRESSURE SUPPORT)
PREFILLED_SYRINGE | INTRAVENOUS | Status: AC
  Filled 2019-06-18: qty 20

## 2019-06-18 MED ORDER — OXYCODONE HCL 5 MG/5ML PO SOLN: 5.0000 mg | Freq: Once | ORAL | Status: AC | PRN

## 2019-06-18 SURGICAL SUPPLY — 94 items
BANDAGE ESMARK 6X9 LF (GAUZE/BANDAGES/DRESSINGS) ×1 IMPLANT
BENZOIN TINCTURE PRP APPL 2/3 (GAUZE/BANDAGES/DRESSINGS) ×3 IMPLANT
BLADE HEX COATED 2.75 (ELECTRODE) ×3 IMPLANT
BLADE SURG 15 STRL LF DISP TIS (BLADE) ×2 IMPLANT
BLADE SURG 15 STRL SS (BLADE) ×6
BNDG ELASTIC 4X5.8 VLCR STR LF (GAUZE/BANDAGES/DRESSINGS) ×2 IMPLANT
BNDG ELASTIC 6X5.8 VLCR STR LF (GAUZE/BANDAGES/DRESSINGS) ×5 IMPLANT
BNDG ESMARK 6X9 LF (GAUZE/BANDAGES/DRESSINGS) ×3
CLOSURE WOUND 1/2 X4 (GAUZE/BANDAGES/DRESSINGS) ×1
COVER BACK TABLE REUSABLE LG (DRAPES) ×2 IMPLANT
COVER WAND RF STERILE (DRAPES) IMPLANT
CUFF TOURN SGL QUICK 24 (TOURNIQUET CUFF)
CUFF TOURN SGL QUICK 34 (TOURNIQUET CUFF) ×2
CUFF TRNQT CYL 24X4X16.5-23 (TOURNIQUET CUFF) IMPLANT
CUFF TRNQT CYL 34X4.125X (TOURNIQUET CUFF) ×1 IMPLANT
DECANTER SPIKE VIAL GLASS SM (MISCELLANEOUS) IMPLANT
DRAPE C-ARM 42X72 X-RAY (DRAPES) ×3 IMPLANT
DRAPE C-ARMOR (DRAPES) ×3 IMPLANT
DRAPE EXTREMITY T 121X128X90 (DISPOSABLE) ×3 IMPLANT
DRAPE IMP U-DRAPE 54X76 (DRAPES) ×3 IMPLANT
DRAPE INCISE IOBAN 66X45 STRL (DRAPES) ×2 IMPLANT
DRAPE U-SHAPE 47X51 STRL (DRAPES) ×3 IMPLANT
DRSG PAD ABDOMINAL 8X10 ST (GAUZE/BANDAGES/DRESSINGS) ×3 IMPLANT
DURAPREP 26ML APPLICATOR (WOUND CARE) ×3 IMPLANT
ELECT REM PT RETURN 9FT ADLT (ELECTROSURGICAL) ×3
ELECTRODE REM PT RTRN 9FT ADLT (ELECTROSURGICAL) ×1 IMPLANT
GAUZE SPONGE 4X4 12PLY STRL (GAUZE/BANDAGES/DRESSINGS) ×6 IMPLANT
GAUZE XEROFORM 1X8 LF (GAUZE/BANDAGES/DRESSINGS) ×5 IMPLANT
GLOVE BIOGEL PI IND STRL 7.0 (GLOVE) ×1 IMPLANT
GLOVE BIOGEL PI INDICATOR 7.0 (GLOVE) ×6
GLOVE ECLIPSE 6.5 STRL STRAW (GLOVE) ×2 IMPLANT
GLOVE ECLIPSE 7.0 STRL STRAW (GLOVE) ×3 IMPLANT
GLOVE SKINSENSE NS SZ7.5 (GLOVE) ×2
GLOVE SKINSENSE STRL SZ7.5 (GLOVE) ×1 IMPLANT
GLOVE SURG SYN 7.5  E (GLOVE) ×4
GLOVE SURG SYN 7.5 E (GLOVE) ×2 IMPLANT
GLOVE SURG SYN 7.5 PF PI (GLOVE) ×1 IMPLANT
GOWN STRL REIN XL XLG (GOWN DISPOSABLE) ×3 IMPLANT
GOWN STRL REUS W/ TWL LRG LVL3 (GOWN DISPOSABLE) ×1 IMPLANT
GOWN STRL REUS W/ TWL XL LVL3 (GOWN DISPOSABLE) ×1 IMPLANT
GOWN STRL REUS W/TWL LRG LVL3 (GOWN DISPOSABLE) ×2
GOWN STRL REUS W/TWL XL LVL3 (GOWN DISPOSABLE) ×2
GRAFT TISS ANT TIB TNDN (Tissue) IMPLANT
IMMOBILIZER KNEE 22 UNIV (SOFTGOODS) IMPLANT
IMMOBILIZER KNEE 24 THIGH 36 (MISCELLANEOUS) IMPLANT
IMMOBILIZER KNEE 24 UNIV (MISCELLANEOUS)
KIT SUTURETAK 3 SPEAR TROCAR (KITS) IMPLANT
KIT TRANSTIBIAL (DISPOSABLE) ×2 IMPLANT
KNEE WRAP E Z 3 GEL PACK (MISCELLANEOUS) ×3 IMPLANT
MANIFOLD NEPTUNE II (INSTRUMENTS) ×3 IMPLANT
NDL SUT 6 .5 CRC .975X.05 MAYO (NEEDLE) IMPLANT
NEEDLE MAYO TAPER (NEEDLE) ×2
NS IRRIG 1000ML POUR BTL (IV SOLUTION) ×11 IMPLANT
PACK ARTHROSCOPY DSU (CUSTOM PROCEDURE TRAY) ×3 IMPLANT
PACK BASIN DAY SURGERY FS (CUSTOM PROCEDURE TRAY) ×3 IMPLANT
PACK ICE MAXI GEL EZY WRAP (MISCELLANEOUS) ×2 IMPLANT
PACK IMPLANT BIOCOMPOSITE MPFL (Orthopedic Implant) ×2 IMPLANT
PAD CAST 4YDX4 CTTN HI CHSV (CAST SUPPLIES) IMPLANT
PADDING CAST COTTON 4X4 STRL (CAST SUPPLIES)
PADDING CAST COTTON 6X4 STRL (CAST SUPPLIES) ×3 IMPLANT
PENCIL BUTTON HOLSTER BLD 10FT (ELECTRODE) ×3 IMPLANT
RETRIEVER SUT HEWSON (MISCELLANEOUS) ×1 IMPLANT
SLEEVE SCD COMPRESS KNEE MED (MISCELLANEOUS) ×2 IMPLANT
SPONGE LAP 18X18 RF (DISPOSABLE) ×3 IMPLANT
STAPLER VISISTAT (STAPLE) IMPLANT
STRIP CLOSURE SKIN 1/2X4 (GAUZE/BANDAGES/DRESSINGS) ×2 IMPLANT
SUCTION FRAZIER HANDLE 10FR (MISCELLANEOUS) ×2
SUCTION TUBE FRAZIER 10FR DISP (MISCELLANEOUS) ×1 IMPLANT
SUT ETHILON 2 0 FS 18 (SUTURE) ×10 IMPLANT
SUT ETHILON 3 0 PS 1 (SUTURE) ×2 IMPLANT
SUT ETHILON 4 0 PS 2 18 (SUTURE) IMPLANT
SUT FIBERWIRE #2 38 T-5 BLUE (SUTURE) ×3
SUT VIC AB 0 CT1 18XCR BRD 8 (SUTURE) ×1 IMPLANT
SUT VIC AB 0 CT1 27 (SUTURE) ×4
SUT VIC AB 0 CT1 27XBRD ANBCTR (SUTURE) ×1 IMPLANT
SUT VIC AB 0 CT1 8-18 (SUTURE)
SUT VIC AB 1 CT1 27 (SUTURE) ×8
SUT VIC AB 1 CT1 27XBRD ANBCTR (SUTURE) IMPLANT
SUT VIC AB 2-0 CT1 27 (SUTURE) ×8
SUT VIC AB 2-0 CT1 TAPERPNT 27 (SUTURE) IMPLANT
SUT VIC AB 2-0 SH 27 (SUTURE) ×2
SUT VIC AB 2-0 SH 27XBRD (SUTURE) ×1 IMPLANT
SUT VIC AB 3-0 FS2 27 (SUTURE) IMPLANT
SUT VIC AB 3-0 SH 27 (SUTURE) ×2
SUT VIC AB 3-0 SH 27X BRD (SUTURE) ×1 IMPLANT
SUTURE FIBERWR #2 38 T-5 BLUE (SUTURE) IMPLANT
SUTURE TAPE 1.3 FIBERLOP 20 ST (SUTURE) ×2 IMPLANT
SUTURETAPE 1.3 FIBERLOOP 20 ST (SUTURE) ×6
TENDON ANTERIOR TIBIALIS (Tissue) ×3 IMPLANT
TOWEL GREEN STERILE FF (TOWEL DISPOSABLE) ×6 IMPLANT
TUBE CONNECTING 20'X1/4 (TUBING)
TUBE CONNECTING 20X1/4 (TUBING) ×1 IMPLANT
UNDERPAD 30X30 (UNDERPADS AND DIAPERS) ×1 IMPLANT
YANKAUER SUCT BULB TIP NO VENT (SUCTIONS) ×3 IMPLANT

## 2019-06-18 NOTE — Anesthesia Preprocedure Evaluation (Signed)
Anesthesia Evaluation  Patient identified by MRN, date of birth, ID band Patient awake    Reviewed: Allergy & Precautions, NPO status , Patient's Chart, lab work & pertinent test results  Airway Mallampati: II  TM Distance: >3 FB Neck ROM: Full    Dental no notable dental hx.    Pulmonary neg pulmonary ROS, former smoker,    Pulmonary exam normal breath sounds clear to auscultation       Cardiovascular negative cardio ROS Normal cardiovascular exam Rhythm:Regular Rate:Normal     Neuro/Psych negative neurological ROS  negative psych ROS   GI/Hepatic negative GI ROS, Neg liver ROS,   Endo/Other  negative endocrine ROS  Renal/GU negative Renal ROS  negative genitourinary   Musculoskeletal negative musculoskeletal ROS (+)   Abdominal   Peds negative pediatric ROS (+)  Hematology negative hematology ROS (+)   Anesthesia Other Findings   Reproductive/Obstetrics negative OB ROS                             Anesthesia Physical Anesthesia Plan  ASA: II  Anesthesia Plan: General   Post-op Pain Management:    Induction: Intravenous  PONV Risk Score and Plan: 2 and Ondansetron, Dexamethasone and Treatment may vary due to age or medical condition  Airway Management Planned: LMA  Additional Equipment:   Intra-op Plan:   Post-operative Plan: Extubation in OR  Informed Consent: I have reviewed the patients History and Physical, chart, labs and discussed the procedure including the risks, benefits and alternatives for the proposed anesthesia with the patient or authorized representative who has indicated his/her understanding and acceptance.   Dental advisory given  Plan Discussed with: CRNA and Surgeon  Anesthesia Plan Comments:         Anesthesia Quick Evaluation  

## 2019-06-18 NOTE — Anesthesia Procedure Notes (Signed)
Procedure Name: Intubation Date/Time: 06/18/2019 7:40 AM Performed by: Marrianne Mood, CRNA Pre-anesthesia Checklist: Patient identified, Emergency Drugs available, Suction available, Patient being monitored and Timeout performed Patient Re-evaluated:Patient Re-evaluated prior to induction Oxygen Delivery Method: Circle system utilized Preoxygenation: Pre-oxygenation with 100% oxygen Induction Type: IV induction Ventilation: Mask ventilation without difficulty Laryngoscope Size: Mac and 4 Grade View: Grade III Tube type: Oral Tube size: 8.0 mm Number of attempts: 1 Airway Equipment and Method: Stylet and Oral airway Placement Confirmation: ETT inserted through vocal cords under direct vision,  positive ETCO2 and breath sounds checked- equal and bilateral Secured at: 22 cm Tube secured with: Tape Dental Injury: Teeth and Oropharynx as per pre-operative assessment

## 2019-06-18 NOTE — Discharge Instructions (Signed)
Next Dose of Ibuprofen, Advil, or Motrin at 4:40 PM     Postoperative instructions:  Weightbearing instructions: as tolerated in knee brace  Dressing instructions: Keep your dressing and/or splint clean and dry at all times.  It will be removed at your first post-operative appointment.  Your stitches and/or staples will be removed at this visit.  Incision instructions:  Do not soak your incision for 3 weeks after surgery.  If the incision gets wet, pat dry and do not scrub the incision.  Pain control:  You have been given a prescription to be taken as directed for post-operative pain control.  In addition, elevate the operative extremity above the heart at all times to prevent swelling and throbbing pain.  Take over-the-counter Colace, 100mg  by mouth twice a day while taking narcotic pain medications to help prevent constipation.  Follow up appointments: 1) 12-14 days for suture removal and wound check. 2) Dr. Roda ShuttersXu as scheduled.   -------------------------------------------------------------------------------------------------------------  After Surgery Pain Control:  After your surgery, post-surgical discomfort or pain is likely. This discomfort can last several days to a few weeks. At certain times of the day your discomfort may be more intense.  Did you receive a nerve block?  A nerve block can provide pain relief for one hour to two days after your surgery. As long as the nerve block is working, you will experience little or no sensation in the area the surgeon operated on.  As the nerve block wears off, you will begin to experience pain or discomfort. It is very important that you begin taking your prescribed pain medication before the nerve block fully wears off. Treating your pain at the first sign of the block wearing off will ensure your pain is better controlled and more tolerable when full-sensation returns. Do not wait until the pain is intolerable, as the medicine will be less  effective. It is better to treat pain in advance than to try and catch up.  General Anesthesia:  If you did not receive a nerve block during your surgery, you will need to start taking your pain medication shortly after your surgery and should continue to do so as prescribed by your surgeon.  Pain Medication:  Most commonly we prescribe Vicodin and Percocet for post-operative pain. Both of these medications contain a combination of acetaminophen (Tylenol) and a narcotic to help control pain.   It takes between 30 and 45 minutes before pain medication starts to work. It is important to take your medication before your pain level gets too intense.   Nausea is a common side effect of many pain medications. You will want to eat something before taking your pain medicine to help prevent nausea.   If you are taking a prescription pain medication that contains acetaminophen, we recommend that you do not take additional over the counter acetaminophen (Tylenol).  Other pain relieving options:   Using a cold pack to ice the affected area a few times a day (15 to 20 minutes at a time) can help to relieve pain, reduce swelling and bruising.   Elevation of the affected area can also help to reduce pain and swelling.   Post Anesthesia Home Care Instructions  Activity: Get plenty of rest for the remainder of the day. A responsible individual must stay with you for 24 hours following the procedure.  For the next 24 hours, DO NOT: -Drive a car -Advertising copywriterperate machinery -Drink alcoholic beverages -Take any medication unless instructed by your physician -Make any legal  decisions or sign important papers.  Meals: Start with liquid foods such as gelatin or soup. Progress to regular foods as tolerated. Avoid greasy, spicy, heavy foods. If nausea and/or vomiting occur, drink only clear liquids until the nausea and/or vomiting subsides. Call your physician if vomiting continues.  Special  Instructions/Symptoms: Your throat may feel dry or sore from the anesthesia or the breathing tube placed in your throat during surgery. If this causes discomfort, gargle with warm salt water. The discomfort should disappear within 24 hours.  If you had a scopolamine patch placed behind your ear for the management of post- operative nausea and/or vomiting:  1. The medication in the patch is effective for 72 hours, after which it should be removed.  Wrap patch in a tissue and discard in the trash. Wash hands thoroughly with soap and water. 2. You may remove the patch earlier than 72 hours if you experience unpleasant side effects which may include dry mouth, dizziness or visual disturbances. 3. Avoid touching the patch. Wash your hands with soap and water after contact with the patch.

## 2019-06-18 NOTE — H&P (Signed)
    PREOPERATIVE H&P  Chief Complaint: Left dislocated patella  HPI: Jonathon Beasley is a 38 y.o. male who presents for surgical treatment of Left dislocated patella.  He denies any changes in medical history.  History reviewed. No pertinent past medical history. Past Surgical History:  Procedure Laterality Date  . KNEE ARTHROSCOPY     Social History   Socioeconomic History  . Marital status: Single    Spouse name: Not on file  . Number of children: Not on file  . Years of education: Not on file  . Highest education level: Not on file  Occupational History  . Not on file  Social Needs  . Financial resource strain: Not on file  . Food insecurity    Worry: Not on file    Inability: Not on file  . Transportation needs    Medical: Not on file    Non-medical: Not on file  Tobacco Use  . Smoking status: Former Smoker    Packs/day: 0.50    Types: Cigarettes  . Smokeless tobacco: Never Used  Substance and Sexual Activity  . Alcohol use: No  . Drug use: Never  . Sexual activity: Not on file  Lifestyle  . Physical activity    Days per week: Not on file    Minutes per session: Not on file  . Stress: Not on file  Relationships  . Social Herbalist on phone: Not on file    Gets together: Not on file    Attends religious service: Not on file    Active member of club or organization: Not on file    Attends meetings of clubs or organizations: Not on file    Relationship status: Not on file  Other Topics Concern  . Not on file  Social History Narrative  . Not on file   History reviewed. No pertinent family history. Allergies  Allergen Reactions  . Shellfish Allergy    Prior to Admission medications   Medication Sig Start Date End Date Taking? Authorizing Provider  cyclobenzaprine (FLEXERIL) 10 MG tablet One half tab PO qHS, then increase gradually to one tab TID. 05/19/19  Yes Silverio Decamp, MD  HYDROcodone-acetaminophen (NORCO) 10-325 MG tablet Take  1 tablet by mouth 2 (two) times daily as needed. 05/19/19  Yes Silverio Decamp, MD     Positive ROS: All other systems have been reviewed and were otherwise negative with the exception of those mentioned in the HPI and as above.  Physical Exam: General: Alert, no acute distress Cardiovascular: No pedal edema Respiratory: No cyanosis, no use of accessory musculature GI: abdomen soft Skin: No lesions in the area of chief complaint Neurologic: Sensation intact distally Psychiatric: Patient is competent for consent with normal mood and affect Lymphatic: no lymphedema  MUSCULOSKELETAL: exam stable  Assessment: Left dislocated patella  Plan: Plan for Procedure(s): LEFT MEDIAL RETINACULUM REPAIR, MEDIAL PATELLA FEMORAL LIGAMENT RECONSTRUCTION  The risks benefits and alternatives were discussed with the patient including but not limited to the risks of nonoperative treatment, versus surgical intervention including infection, bleeding, nerve injury,  blood clots, cardiopulmonary complications, morbidity, mortality, among others, and they were willing to proceed.   Eduard Roux, MD   06/18/2019 7:29 AM

## 2019-06-18 NOTE — Anesthesia Procedure Notes (Signed)
Anesthesia Regional Block: Femoral nerve block   Pre-Anesthetic Checklist: ,, timeout performed, Correct Patient, Correct Site, Correct Laterality, Correct Procedure, Correct Position, site marked, Risks and benefits discussed,  Surgical consent,  Pre-op evaluation,  At surgeon's request and post-op pain management  Laterality: Left  Prep: chloraprep       Needles:  Injection technique: Single-shot  Needle Type: Echogenic Needle     Needle Length: 9cm      Additional Needles:   Procedures:,,,, ultrasound used (permanent image in chart),,,,  Narrative:  Start time: 06/18/2019 7:01 AM End time: 06/18/2019 7:11 AM Injection made incrementally with aspirations every 5 mL.  Performed by: Personally   Additional Notes: Patient tolerated the procedure well without complications

## 2019-06-18 NOTE — Transfer of Care (Signed)
Immediate Anesthesia Transfer of Care Note  Patient: Jonathon Beasley  Procedure(s) Performed: LEFT MEDIAL RETINACULUM REPAIR, (Left Knee) MEDIAL PATELLA FEMORAL LIGAMENT RECONSTRUCTION, LATERAL RELEASE (Left Knee)  Patient Location: PACU  Anesthesia Type:GA combined with regional for post-op pain  Level of Consciousness: awake and patient cooperative  Airway & Oxygen Therapy: Patient Spontanous Breathing and Patient connected to face mask oxygen  Post-op Assessment: Report given to RN and Post -op Vital signs reviewed and stable  Post vital signs: Reviewed and stable  Last Vitals:  Vitals Value Taken Time  BP    Temp    Pulse 97 06/18/19 1012  Resp 11 06/18/19 1012  SpO2 100 % 06/18/19 1012  Vitals shown include unvalidated device data.  Last Pain:  Vitals:   06/18/19 0629  TempSrc: Oral  PainSc: 8       Patients Stated Pain Goal: 4 (01/23/30 4388)  Complications: No apparent anesthesia complications

## 2019-06-18 NOTE — Progress Notes (Signed)
Assisted Dr. Rose with left, ultrasound guided, femoral block. Side rails up, monitors on throughout procedure. See vital signs in flow sheet. Tolerated Procedure well. 

## 2019-06-18 NOTE — Op Note (Signed)
Date of Surgery: 06/18/2019  INDICATIONS: Jonathon Beasley is a 38 y.o.-year-old male with a chronic lateral dislocation of left patella;  The patient did consent to the procedure after discussion of the risks and benefits.  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  PROCEDURE:  1.  Left knee open lateral release 2.  Left knee medial patellofemoral ligament reconstruction 3.  Left knee medial retinacular repair and reefing  SURGEON: N. Eduard Roux, M.D.  ASSIST: Ciro Backer [place], [place]; necessary for the timely completion of procedure and due to complexity of procedure.  ANESTHESIA:  general, femoral block  IV FLUIDS AND URINE: See anesthesia.  ESTIMATED BLOOD LOSS: Minimal mL.  IMPLANTS: Arthrex 4.75 mm swivel lock x 2, Arthrex 6 x 20 interference screw  DRAINS: None  COMPLICATIONS: see description of procedure.  DESCRIPTION OF PROCEDURE: The patient was brought to the operating room and placed supine on the operating table.  The patient had been signed prior to the procedure and this was documented. The patient had the anesthesia placed by the anesthesiologist.  A time-out was performed to confirm that this was the correct patient, site, side and location. The patient did receive antibiotics prior to the incision and was re-dosed during the procedure as needed at indicated intervals.  A tourniquet was placed.  The patient had the operative extremity prepped and draped in the standard surgical fashion.    A midline incision was made directly over the anterior aspect of the knee.  Dissection was carried down through the subcutaneous tissue and full-thickness flaps were elevated both medially and laterally.  We continued our dissection down onto the joint capsule and the peritenon was elevated off of the patellar tendon.  We encountered a laterally dislocated patella which could  not be reduced back into the trochlea.  I first performed a lateral release in an open fashion from just superior to the superior margin of the patella to just inferior to the inferior pole of the patella.  After the lateral release I could mobilize the patella back to the trochlea.  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  The apex of the lateral femoral trochlea also had a small area of chondral injury.  Once the attenuated tissue was debrided we then turned our attention to reconstruction of the MPFL.  Using the preassembled Arthrex kit I drilled 2 K wires in a transverse fashion across the patella with the superior one near the junction of the upper third and the middle third and the inferior K wire at the junction of the middle third and the inferior third.  This was confirmed under fluoroscopy.  A reamer was then used to overdrilled the K wire and the 2 ends of the tibialis anterior allograft were then fixed to the patella using the 4.75 mm swivel locks.  This had excellent purchase.  Then I used the targeting guide to find the attachment on the medial femoral condyle for the MPFL.  The K wire was drilled all the way across the condyles using fluoroscopic guidance.  This was then overdrilled and the looped end of the allograft was brought through the hole in the medial femoral condyle with a FiberWire stitch.  The graft was then tensioned appropriately and a 6 x 20 interference screw was advanced through the hole.  This also had excellent purchase.  I then performed a medial retinacular reefing and repair using the #2 fiber wire sutures as well as #1 Vicryl sutures.  The joint and the surgical wound were then thoroughly irrigated.  The tourniquet was deflated and hemostasis was obtained.  Surgical wound was closed in a layered fashion.  Sterile dressings  were applied.  A Bledsoe brace was then placed.  Patient tolerated procedure well had no any complications.  POSTOPERATIVE PLAN: Discharge home.  Weight-bear as tolerated in the Bledsoe brace with the knee in extension.  Follow-up in 2 weeks for suture removal.  Jonathon Cecil, MD 9:24 AM

## 2019-06-18 NOTE — Anesthesia Procedure Notes (Signed)
Anesthesia Procedure Image    

## 2019-06-18 NOTE — Anesthesia Postprocedure Evaluation (Signed)
Anesthesia Post Note  Patient: Jonathon Beasley  Procedure(s) Performed: LEFT MEDIAL RETINACULUM REPAIR, (Left Knee) MEDIAL PATELLA FEMORAL LIGAMENT RECONSTRUCTION, LATERAL RELEASE (Left Knee)     Patient location during evaluation: PACU Anesthesia Type: General Level of consciousness: awake and alert Pain management: pain level controlled Vital Signs Assessment: post-procedure vital signs reviewed and stable Respiratory status: spontaneous breathing, nonlabored ventilation, respiratory function stable and patient connected to nasal cannula oxygen Cardiovascular status: blood pressure returned to baseline and stable Postop Assessment: no apparent nausea or vomiting Anesthetic complications: no    Last Vitals:  Vitals:   06/18/19 1100 06/18/19 1140  BP: 109/75 (!) 150/89  Pulse: 93 91  Resp: 16 18  Temp:  37.1 C  SpO2: 95% 98%    Last Pain:  Vitals:   06/18/19 1140  TempSrc:   PainSc: 4                  [name]

## 2019-06-18 NOTE — Anesthesia Procedure Notes (Signed)
Procedure Name: LMA Insertion Date/Time: 06/18/2019 7:38 AM Performed by: Marrianne Mood, CRNA Pre-anesthesia Checklist: Patient identified, Emergency Drugs available, Suction available, Patient being monitored and Timeout performed Patient Re-evaluated:Patient Re-evaluated prior to induction Oxygen Delivery Method: Circle system utilized Preoxygenation: Pre-oxygenation with 100% oxygen Induction Type: IV induction Ventilation: Mask ventilation without difficulty LMA: LMA inserted LMA Size: 5.0 Number of attempts: 1 Airway Equipment and Method: Bite block Placement Confirmation: positive ETCO2 Tube secured with: Tape Dental Injury: Teeth and Oropharynx as per pre-operative assessment

## 2019-06-21 ENCOUNTER — Encounter (HOSPITAL_BASED_OUTPATIENT_CLINIC_OR_DEPARTMENT_OTHER): Payer: Self-pay | Admitting: Orthopaedic Surgery

## 2019-06-23 ENCOUNTER — Telehealth: Payer: Self-pay | Admitting: Physician Assistant

## 2019-06-23 ENCOUNTER — Other Ambulatory Visit: Payer: Self-pay | Admitting: Physician Assistant

## 2019-06-23 ENCOUNTER — Telehealth: Payer: Self-pay

## 2019-06-23 MED ORDER — OXYCODONE-ACETAMINOPHEN 5-325 MG PO TABS: 1.0000 | ORAL_TABLET | Freq: Two times a day (BID) | ORAL | 0 refills | Status: DC | PRN

## 2019-06-23 NOTE — Telephone Encounter (Signed)
Just called in

## 2019-06-23 NOTE — Telephone Encounter (Signed)
Please advise. Thanks.  

## 2019-06-23 NOTE — Telephone Encounter (Signed)
IC advised done 

## 2019-06-23 NOTE — Telephone Encounter (Signed)
Patient called needing Rx refilled (Oxycodone) The number to contact patient is [phone]

## 2019-06-23 NOTE — Telephone Encounter (Signed)
Patient called stating that when he removed the ace bandage he noticed a lot of little red bugs. Patient wanting to know if Dr T can evaluated what is going on and identify bugs...  I advised patient that Dr T is out of the office this week and we do not have any openings... I recommended that he call and contact his surgeons office since it is an issue with his surgical site. Patient was agreeable but stated that he wanted to try [place] because he didn't want to drive to [facility].   FYI to PCP

## 2019-06-23 NOTE — Telephone Encounter (Signed)
BUGS?!?!?!?  Yeah that's going to need a PCP, but since surgical site I would recommend Dr. Erlinda Hong first to ensure no wound infection/infestation.  My role in his care with regards to his knee has passed.

## 2019-07-01 ENCOUNTER — Other Ambulatory Visit: Payer: Self-pay

## 2019-07-01 ENCOUNTER — Ambulatory Visit (INDEPENDENT_AMBULATORY_CARE_PROVIDER_SITE_OTHER): Payer: Self-pay | Admitting: Orthopaedic Surgery

## 2019-07-01 DIAGNOSIS — S83005D Unspecified dislocation of left patella, subsequent encounter: Secondary | ICD-10-CM

## 2019-07-01 MED ORDER — OXYCODONE-ACETAMINOPHEN 5-325 MG PO TABS: 1.0000 | ORAL_TABLET | Freq: Two times a day (BID) | ORAL | 0 refills | Status: AC | PRN

## 2019-07-01 NOTE — Progress Notes (Signed)
   Post-Op Visit Note   Patient: Jonathon Beasley           Date of Birth: 09-Dec-1980           MRN: [id number] Visit Date: 07/01/2019 PCP: Patient, No Pcp Per   Assessment & Plan:  Chief Complaint:  Chief Complaint  Patient presents with  . Left Knee - Routine Post Op   Visit Diagnoses:  1. Closed dislocation of left patella, subsequent encounter     Plan: Patient is a pleasant 38 year old gentleman who presents our clinic today 13 days status post left knee medial retinaculum repair and MPFL reconstruction, date of surgery 06/18/2019.  He has been compliant weightbearing as tolerated in his Bledsoe knee brace locked in full extension.  He has been complaining of moderate pain.  No fevers or chills.  Examination of his left knee reveals well-healing surgical incision with nylon sutures in place.  No evidence of infection or cellulitis.  He does have a 2+ effusion.  His calf is soft and nontender.  He is neurovascularly intact distally.  Today, nylon sutures were removed and Steri-Strips applied.  He will ice and elevate for pain and swelling.  We will unlock his brace to 50 degrees of flexion.  He will wear this at all times.  We will start him in formal physical therapy for gentle range of motion.  He will follow-up with [place] in 4 weeks time for repeat evaluation.  Call with concerns or questions in the meantime.   Follow-Up Instructions: Return in about 4 weeks (around 07/29/2019).   Orders:  No orders of the defined types were placed in this encounter.  No orders of the defined types were placed in this encounter.   Imaging: No new imaging  PMFS History: Patient Active Problem List   Diagnosis Date Noted  . Closed dislocation of patella, left, subsequent encounter 06/18/2019  . Patellar dislocation, left, sequela 05/19/2019  . Rt groin pain 03/01/2014  . Preventive measure 03/01/2014  . Knee pain, right 02/03/2014   No past medical history on file.  No family history on file.   Past Surgical History:  Procedure Laterality Date  . KNEE ARTHROSCOPY    . MEDIAL PATELLOFEMORAL LIGAMENT REPAIR Left 06/18/2019   Procedure: MEDIAL PATELLA FEMORAL LIGAMENT RECONSTRUCTION, LATERAL RELEASE;  Surgeon: Leandrew Koyanagi, MD;  Location: [place];  Service: Orthopedics;  Laterality: Left;  . PATELLAR TENDON REPAIR Left 06/18/2019   Procedure: LEFT MEDIAL RETINACULUM REPAIR,;  Surgeon: Leandrew Koyanagi, MD;  Location: [facility];  Service: Orthopedics;  Laterality: Left;   Social History   Occupational History  . Not on file  Tobacco Use  . Smoking status: Former Smoker    Packs/day: 0.50    Types: Cigarettes  . Smokeless tobacco: Never Used  Substance and Sexual Activity  . Alcohol use: No  . Drug use: Never  . Sexual activity: Not on file

## 2019-07-08 ENCOUNTER — Telehealth: Payer: Self-pay | Admitting: Orthopaedic Surgery

## 2019-07-08 MED ORDER — OXYCODONE-ACETAMINOPHEN 5-325 MG PO TABS: 1.0000 | ORAL_TABLET | Freq: Every day | ORAL | 0 refills | Status: AC | PRN

## 2019-07-08 NOTE — Telephone Encounter (Signed)
Please advise. Thanks.  

## 2019-07-08 NOTE — Telephone Encounter (Signed)
Patient called requesting an RX refill on his Oxycodone.  [facility] uses [facility] [address] in [place].  CB#[phone].  Thank you.

## 2019-07-13 ENCOUNTER — Telehealth: Payer: Self-pay | Admitting: Orthopaedic Surgery

## 2019-07-13 MED ORDER — HYDROCODONE-ACETAMINOPHEN 5-325 MG PO TABS: 1.0000 | ORAL_TABLET | Freq: Two times a day (BID) | ORAL | 0 refills | Status: DC | PRN

## 2019-07-13 NOTE — Telephone Encounter (Signed)
Patient called and stated he wanted his pharmacy [facility] [address] in [place].  Please call to advise patient once change is [phone] 

## 2019-07-13 NOTE — Telephone Encounter (Signed)
Received call from Manuela Schwartz with [facility] outpatient rehab advised there is not a signature on the referral. Manuela Schwartz asked if Dr Erlinda Hong would sign the referral and have it faxed to her. The fax# is [phone]   The fax# ia [phone]

## 2019-07-13 NOTE — Telephone Encounter (Signed)
Patient called and stated he wanted his [facility] [address] in [place].  Please call to advise patient once change is [phone]

## 2019-07-13 NOTE — Telephone Encounter (Signed)
Added not to previous request.

## 2019-07-13 NOTE — Telephone Encounter (Signed)
I sent in norco instead.  He's too far out from surgery to need oxy

## 2019-07-13 NOTE — Telephone Encounter (Signed)
Patient called requesting an RX refill on Oxycodone.  [facility] [place].  CB#[phone].  Thank you.

## 2019-07-14 ENCOUNTER — Other Ambulatory Visit: Payer: Self-pay

## 2019-07-14 NOTE — Telephone Encounter (Signed)
Order written PT gentle ROM s/p closed dislocation left knee and faxed.

## 2019-07-14 NOTE — Telephone Encounter (Signed)
Left message on voicemail advising

## 2019-07-19 ENCOUNTER — Ambulatory Visit: Payer: Medicaid Other | Attending: Orthopaedic Surgery | Admitting: Physical Therapy

## 2019-07-26 ENCOUNTER — Telehealth: Payer: Self-pay | Admitting: Orthopaedic Surgery

## 2019-07-26 NOTE — Telephone Encounter (Signed)
Patient called and requested a refill on Hydrocodone.  Please call patient to advise.[phone]  [facility] [address] in [place]

## 2019-07-26 NOTE — Telephone Encounter (Signed)
Please advise 

## 2019-07-27 ENCOUNTER — Other Ambulatory Visit: Payer: Self-pay | Admitting: Physician Assistant

## 2019-07-27 MED ORDER — HYDROCODONE-ACETAMINOPHEN 5-325 MG PO TABS: 1.0000 | ORAL_TABLET | Freq: Two times a day (BID) | ORAL | 0 refills | Status: AC | PRN

## 2019-07-27 NOTE — Telephone Encounter (Signed)
Sent in.  Needs to start weaning off of this

## 2019-07-27 NOTE — Telephone Encounter (Signed)
I called patient and advised. 

## 2019-07-29 ENCOUNTER — Ambulatory Visit: Payer: Self-pay | Admitting: Orthopaedic Surgery

## 2019-08-05 ENCOUNTER — Telehealth: Payer: Self-pay

## 2019-08-05 ENCOUNTER — Other Ambulatory Visit: Payer: Self-pay

## 2019-08-05 ENCOUNTER — Encounter: Payer: Self-pay | Admitting: Physical Therapy

## 2019-08-05 ENCOUNTER — Ambulatory Visit: Payer: Self-pay | Attending: Orthopaedic Surgery | Admitting: Physical Therapy

## 2019-08-05 DIAGNOSIS — M6281 Muscle weakness (generalized): Secondary | ICD-10-CM | POA: Insufficient documentation

## 2019-08-05 DIAGNOSIS — R2689 Other abnormalities of gait and mobility: Secondary | ICD-10-CM | POA: Insufficient documentation

## 2019-08-05 DIAGNOSIS — M25662 Stiffness of left knee, not elsewhere classified: Secondary | ICD-10-CM | POA: Insufficient documentation

## 2019-08-05 DIAGNOSIS — M25562 Pain in left knee: Secondary | ICD-10-CM | POA: Insufficient documentation

## 2019-08-05 NOTE — Telephone Encounter (Signed)
Jonathon Beasley at [facility] OT rehab called stating that they need a protocol for patient's left knee.  Please fax to [phone].  CB# is [phone].  Please advise.  Thank you.

## 2019-08-05 NOTE — Therapy (Addendum)
[facility] [address]  [place], [place], [address] Phone: [phone]   Fax:  [phone]  Physical Therapy Evaluation / Discharge Summary  Patient Details  Name: Jonathon Beasley MRN: [id number] Date of Birth: 1981/07/14 Referring Provider (PT): Azucena Cecil, MD   Encounter Date: 08/05/2019  PT End of Session - 08/05/19 1615    Visit Number  1    Number of Visits  16    Date for PT Re-Evaluation  09/30/19    Authorization Type  Self-pay    PT Start Time  1615    PT Stop Time  1658    PT Time Calculation (min)  43 min    Activity Tolerance  Patient tolerated treatment well    Behavior During Therapy  [facility] for tasks assessed/performed       History reviewed. No pertinent past medical history.  Past Surgical History:  Procedure Laterality Date  . KNEE ARTHROSCOPY    . MEDIAL PATELLOFEMORAL LIGAMENT REPAIR Left 06/18/2019   Procedure: MEDIAL PATELLA FEMORAL LIGAMENT RECONSTRUCTION, LATERAL RELEASE;  Surgeon: Leandrew Koyanagi, MD;  Location: [facility];  Service: Orthopedics;  Laterality: Left;  . PATELLAR TENDON REPAIR Left 06/18/2019   Procedure: LEFT MEDIAL RETINACULUM REPAIR,;  Surgeon: Leandrew Koyanagi, MD;  Location: [place];  Service: Orthopedics;  Laterality: Left;    There were no vitals filed for this visit.   Subjective Assessment - 08/05/19 1621    Subjective  Pt reports initial injury where someone fell on his knee with chronic patellar dislocation. Surgery on 06/18/19. Intermittently wearing brace (not worn to eval). Per pt, no restrictions per MD (protocol requested from MD office).    Pertinent History  06/18/19 - Left knee open lateral release, Left knee medial patellofemoral ligament reconstruction, Left knee medial retinacular repair and reefing secondary to chronic lateral dislocation of left patella    Limitations  Standing;Walking    How long can you stand comfortably?   30 minutes    How long can you walk comfortably?  unsure - has not atempted longer walks    Patient Stated Goals  "to be able to get bakc to work"    Currently in Pain?  Yes    Pain Score  5     Pain Location  Knee    Pain Orientation  Left;Lateral;Anterior    Pain Descriptors / Indicators  Throbbing   "pulsating"   Pain Type  Acute pain;Surgical pain    Pain Radiating Towards  n/a    Pain Onset  More than a month ago    Pain Frequency  Intermittent    Aggravating Factors   laying with knee straight    Pain Relieving Factors  moving knee - "working it out"    Effect of Pain on Daily Activities  not limited by pain         [facility] PT Assessment - 08/05/19 1615      Assessment   Medical Diagnosis  L knee MPFL reconstruction s/p closed dislocation    Referring Provider (PT)  N. Eduard Roux, MD    Onset Date/Surgical Date  06/22/19    Next MD Visit  none scheduled    Prior Therapy  none      Balance Screen   Has the patient fallen in the past 6 months  No    Has the patient had a decrease in activity level because of a fear of falling?  No    Is the patient reluctant to leave their home because of a fear of falling?   No      Home Environment   Living Environment  Private residence    Type of [place] to enter    Home Layout  Two level;Bed/bath upstairs      Prior Function   Level of Independence  Independent    Vocation  Full time employment   currently out of work due to surgery   Vocation Requirements  bending, lifting, walking around    Leisure  mostly sedentary      Cognition   Overall Cognitive Status  Within Functional Limits for tasks assessed      Observation/Other Assessments   Focus on Therapeutic Outcomes (FOTO)   Knee - 41% (59% limitation); Predicted 75% (25% limitation)      ROM / Strength   AROM / PROM / Strength  AROM;PROM;Strength      AROM   AROM Assessment Site  Knee    Right/Left Knee  Right;Left    Right Knee  Extension  -1    Right Knee Flexion  130    Left Knee Extension  7   10 dg in LAQ   Left Knee Flexion  121      PROM   PROM Assessment Site  Knee    Right/Left Knee  Left    Left Knee Extension  4    Left Knee Flexion  123      Strength   Strength Assessment Site  Hip;Knee    Right/Left Hip  Right;Left    Right Hip Flexion  5/5    Right Hip Extension  5/5    Right Hip ABduction  4+/5    Right Hip ADduction  4/5    Left Hip Flexion  4+/5    Left Hip Extension  4/5    Left Hip ABduction  4-/5    Left Hip ADduction  4-/5    Right/Left Knee  Right;Left    Right Knee Flexion  5/5    Right Knee Extension  5/5    Left Knee Flexion  4/5    Left Knee Extension  4-/5      Flexibility   Soft Tissue Assessment /Muscle Length  yes    Quadriceps  mod tight L       Palpation   Patella mobility  L knee restricted all directions    Palpation comment  moderate to significant peri-patellar edema                Objective measurements completed on examination: See above findings.      [facility] Adult PT Treatment/Exercise - 08/05/19 1615      Self-Care   Self-Care  RICE;Scar Mobilizations    RICE  Encourage ice & elevation rather than heat to reduce edema in anterior knee.    Scar Mobilizations  Instructed pt in L knee incisional scar mobilization.      Exercises   Exercises  Knee/Hip      Knee/Hip Exercises: Supine   Quad Sets  Left;10 reps;AROM;Strengthening    Quad Sets Limitations  towel roll under heel    Heel Slides  Left;10 reps;AROM;AAROM    Heel Slides Limitations  supine with strap assist for stretch at end ROM    Straight Leg Raises  Left;10 reps;Strengthening    Straight Leg Raises Limitations  cues for quad set prior to intiation  of lift      Knee/Hip Exercises: Sidelying   Hip ABduction  Left;10 reps;Strengthening    Hip ABduction Limitations  cues to maintain knee extension & keep foot level to avoid hip rotation    Hip ADduction  Left;10  reps;Strengthening    Hip ADduction Limitations  cues to maintain knee extension & keep foot level to avoid hip rotation      Knee/Hip Exercises: Prone   Straight Leg Raises  Left;10 reps;Strengthening             PT Education - 08/05/19 1658    Education Details  PT eval findings, anticipated POC, initial HEP & RICE principle    Person(s) Educated  Patient    Methods  Explanation;Demonstration;Handout    Comprehension  Verbalized understanding;Returned demonstration;Need further instruction       PT Short Term Goals - 08/05/19 1658      PT SHORT TERM GOAL #1   Title  Patient will be independent with initial HEP    Status  New    Target Date  08/26/19        PT Long Term Goals - 08/05/19 1658      PT LONG TERM GOAL #1   Title  Patient will be independent with ongoing/advanced HEP    Status  New    Target Date  09/30/19      PT LONG TERM GOAL #2   Title  L knee AROM WNL w/o pain    Status  New    Target Date  09/30/19      PT LONG TERM GOAL #3   Title  L hip and knee strength >/= 4+/5 for improved stability    Status  New    Target Date  09/30/19      PT LONG TERM GOAL #4   Title  Decrease pain allowing patient to return to normal functional activities and return to work with minimal to no pain (no more than 0/10 to 2/10 following activities)    Status  New    Target Date  09/30/19      PT LONG TERM GOAL #5   Title  Patient to report ability to perform ADLs, household and work-related tasks without increased pain    Status  New    Target Date  09/30/19             Plan - 08/05/19 1658    Clinical Impression Statement  Jonathon Beasley is a 38 y/o male who presents to OP PT 7 weeks s/p Left knee open lateral release with MPFL reconstruction and medial retinacular repair and reefing on 06/18/2019 secondary to chronic lateral dislocation of L patella. He arrives to PT w/o knee brace but states he still wears brace intermittently. Significant L peripatellar edema  present with poor patellar mobility and limited scar tissue mobility. L knee AROM limited to 7-121 dg with PROM 4-123 dg. Mild to moderate L LE weakness evident in L hip and knee. Initiated HEP to promote full L knee ROM and proximal strengthening and will progress exercises per MPFL rehab protocol.    Personal Factors and Comorbidities  Time since onset of injury/illness/exacerbation    Examination-Activity Limitations  Bend;Lift;Locomotion Level;Squat;Stairs;Stand;Transfers    Examination-Participation Restrictions  Cleaning;Yard Work;Other   work   Merchant [employer] officer  Stable/Uncomplicated    Designer, jewellery  Low    Rehab Potential  Fair   orders to start PT 2 weeks post-op but did not schedule eval until 7  weeks post-op   PT Frequency  2x / week   1-2x/wk - pt wishing to try 1x/wk due to self-pay (no insurance)   PT Duration  8 weeks    PT Treatment/Interventions  ADLs/Self Care Home Management;Cryotherapy;Electrical Stimulation;Iontophoresis 41m/ml Dexamethasone;Gait training;Stair training;Functional mobility training;Therapeutic activities;Therapeutic exercise;Balance training;Neuromuscular re-education;Patient/family education;Manual techniques;Passive range of motion;Dry needling;Taping;Joint Manipulations    PT Next Visit Plan  per MPFL rehab protocol - week #7 as of 08/06/19    PT Home Exercise Plan  08/05/19 - scar mobs, quad sets, heel slides, 4-way SLR    Consulted and Agree with Plan of Care  Patient       Patient will benefit from skilled therapeutic intervention in order to improve the following deficits and impairments:  Abnormal gait, Decreased activity tolerance, Decreased balance, Decreased endurance, Decreased knowledge of precautions, Decreased mobility, Decreased range of motion, Decreased safety awareness, Decreased strength, Difficulty walking, Hypomobility, Increased edema, Increased fascial restricitons, Decreased scar mobility, Impaired perceived  functional ability, Impaired flexibility, Pain  Visit Diagnosis: Stiffness of left knee, not elsewhere classified - Plan: PT plan of care cert/re-cert  Acute pain of left knee - Plan: PT plan of care cert/re-cert  Muscle weakness (generalized) - Plan: PT plan of care cert/re-cert  Other abnormalities of gait and mobility - Plan: PT plan of care cert/re-cert     Problem List Patient Active Problem List   Diagnosis Date Noted  . Closed dislocation of patella, left, subsequent encounter 06/18/2019  . Patellar dislocation, left, sequela 05/19/2019  . Rt groin pain 03/01/2014  . Preventive measure 03/01/2014  . Knee pain, right 02/03/2014    JPercival Spanish PT, MPT 08/05/2019, 6:54 PM  [facility] [place] [address]Phone: [phone]  Fax:  [phone] Name: SNoland PizanoMRN: [id number] of Birth: [birth year]  PHYSICAL THERAPY DISCHARGE SUMMARY  Visits from Start of Care: 1  Current functional level related to goals / functional outcomes:   Refer to above clinical impression for status as of eval visit on 08/05/2019. Patient has no showed for all scheduled treatment visits, therefore will proceed with discharge from PT for this episode.   Remaining deficits:   As above.   Education / Equipment:   Initial HEP  Plan: Patient agrees to discharge.  Patient goals were not met. Patient is being discharged due to not returning since the last visit.  ?????     JPercival Spanish PT, MPT 09/06/19, 1:00 PM  [facility] [place] [place] [address]Phone: [phone]  Fax:  [phone]

## 2019-08-06 NOTE — Telephone Encounter (Signed)
Please advise on protocol.

## 2019-08-06 NOTE — Telephone Encounter (Signed)
I called Susan and advised. 

## 2019-08-06 NOTE — Telephone Encounter (Signed)
I don't have a specific protocol.  I would just have them do what they usually do for MPFL reconstruction and lateral release.

## 2019-08-19 ENCOUNTER — Ambulatory Visit: Payer: Self-pay | Admitting: Physical Therapy

## 2019-08-26 ENCOUNTER — Ambulatory Visit: Payer: Self-pay | Attending: Orthopaedic Surgery | Admitting: Physical Therapy

## 2019-09-02 ENCOUNTER — Ambulatory Visit: Payer: Self-pay | Admitting: Physical Therapy

## 2021-03-01 IMAGING — MR MRI OF THE LEFT KNEE WITHOUT CONTRAST
7 series · 40 of 40 positions shown · non-contrast
Comparison: Plain films left knee from [REDACTED]
05/25/2019.

CLINICAL DATA: Left knee pain and swelling. The patient dislocated
his patella in February 2019. Initial encounter.

EXAM:
MRI OF THE LEFT KNEE WITHOUT CONTRAST
TECHNIQUE: Multiplanar, multisequence MR imaging of the knee was performed. No
intravenous contrast was administered.

[Series 5: T2 fat-sat · axial · 4.0mm · 0.53mm/px · z∈[-117,+53]mm · 7 of 35 slices shown (1 of 3)]
[im 1/35]
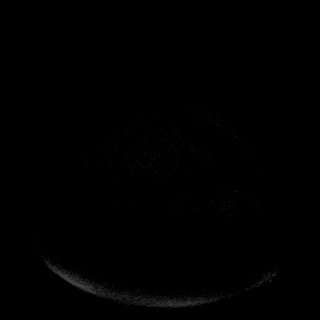
[im 6/35]
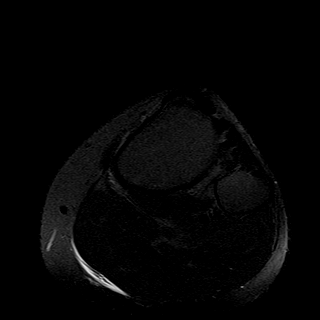
[im 12/35]
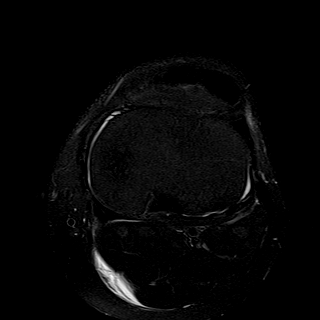
[im 18/35]
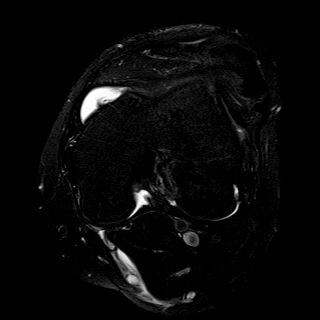
[im 23/35]
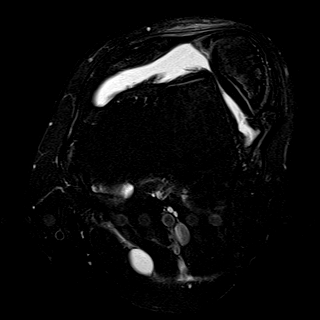
[im 29/35]
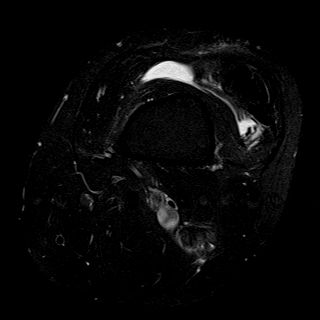
[im 35/35]
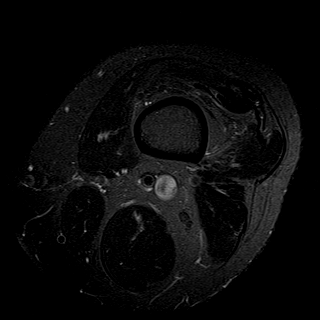

[Series 6: T1 · coronal · 4.0mm · 0.62mm/px · 6 of 34 slices shown]
[im 1/34]
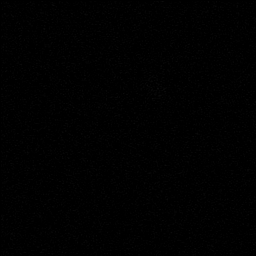
[im 7/34]
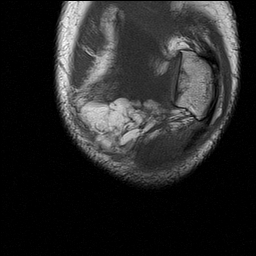
[im 14/34]
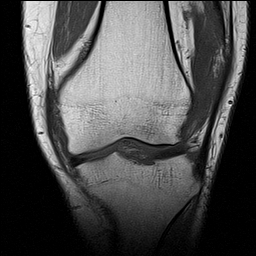
[im 20/34]
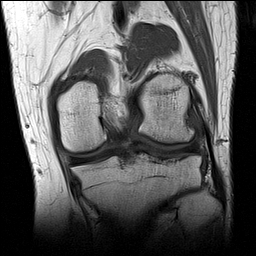
[im 27/34]
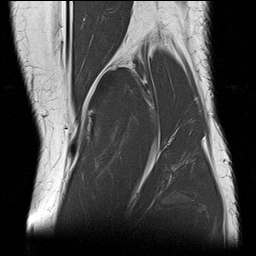
[im 34/34]
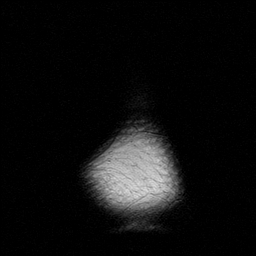

[Series 7: T2 fat-sat · coronal · 4.0mm · 0.62mm/px · 6 of 34 slices shown (2 of 3)]
[im 1/34]
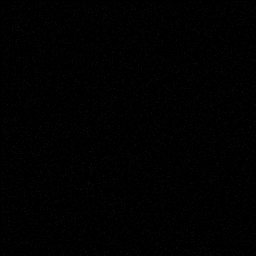
[im 7/34]
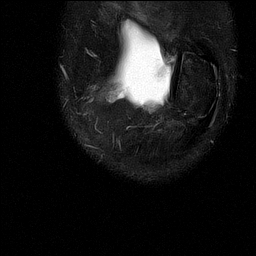
[im 14/34]
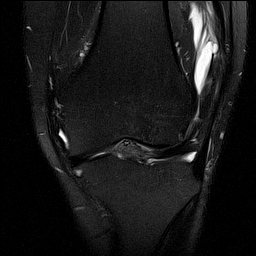
[im 20/34]
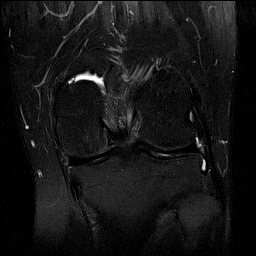
[im 27/34]
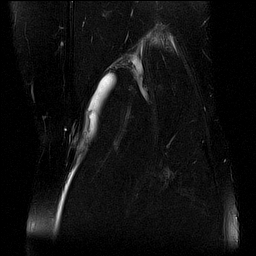
[im 34/34]
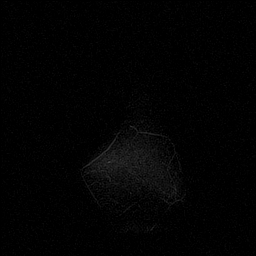

[Series 8: PD fat-sat · coronal · 4.0mm · 0.62mm/px · 6 of 34 slices shown (1 of 3)]
[im 1/34]
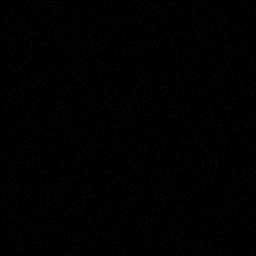
[im 7/34]
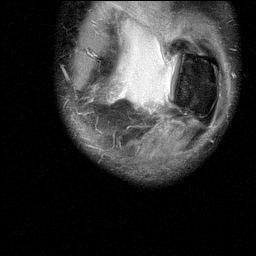
[im 14/34]
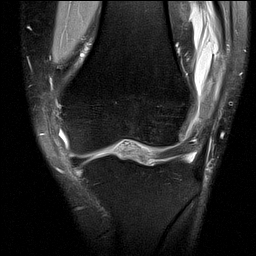
[im 20/34]
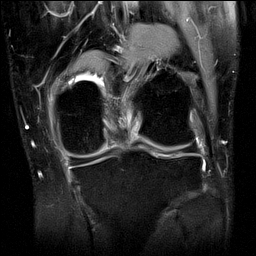
[im 27/34]
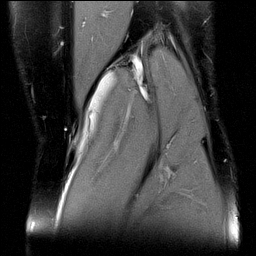
[im 34/34]
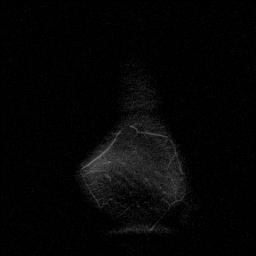

[Series 9: PD fat-sat · sagittal · 3.0mm · 0.66mm/px · 6 of 36 slices shown (2 of 3)]
[im 1/36]
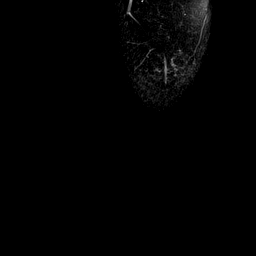
[im 8/36]
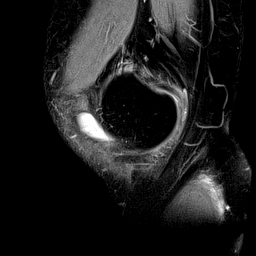
[im 15/36]
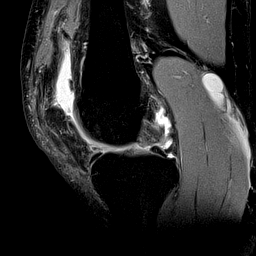
[im 22/36]
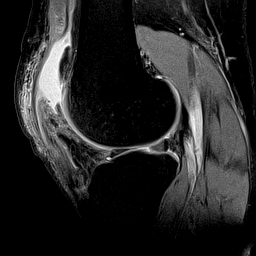
[im 29/36]
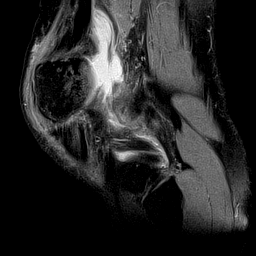
[im 36/36]
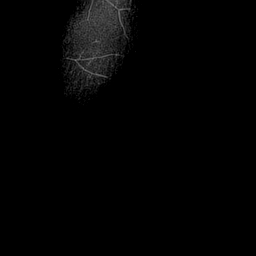

[Series 10: T2 fat-sat · sagittal · 3.0mm · 0.62mm/px · 6 of 37 slices shown (3 of 3)]
[im 1/37]
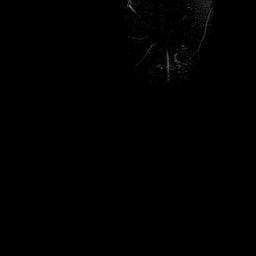
[im 8/37]
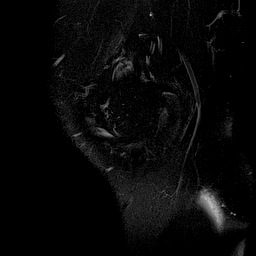
[im 15/37]
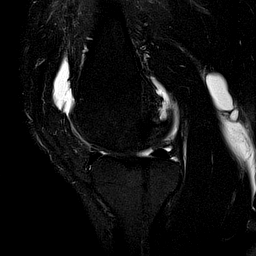
[im 22/37]
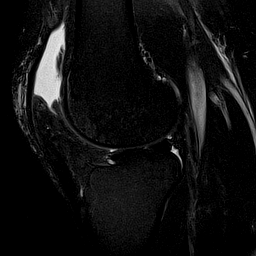
[im 29/37]
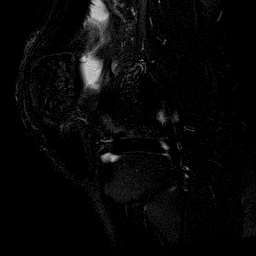
[im 37/37]
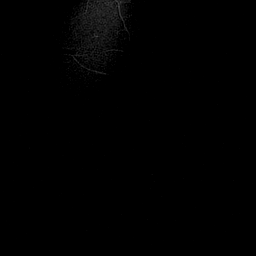

[Series 11: PD fat-sat · oblique · 2.0mm · 0.62mm/px · 3 of 19 slices shown (3 of 3)]
[im 1/19]
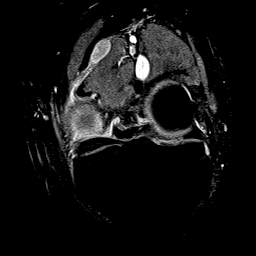
[im 10/19]
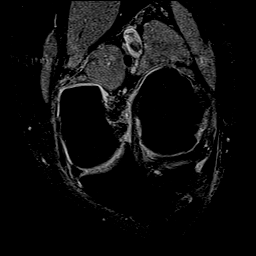
[im 19/19]
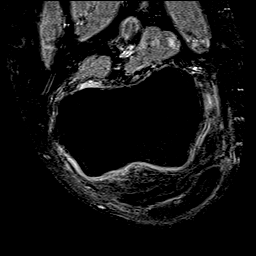

[40 of 40 positions shown; findings below may reference images not displayed]

FINDINGS: MENISCI

Medial meniscus:  Intact.

Lateral meniscus:  Intact.

LIGAMENTS

Cruciates:  Intact.

Collaterals:  Intact.

CARTILAGE

Patellofemoral:  Appears preserved.

Medial:  Preserved.

Lateral: Moderate thinning and irregularity are seen on the lateral
tibial plateau.

Joint:  Moderate joint effusion.

Popliteal Fossa: Baker's cyst measures approximately 4.4 cm AP by
0.8 cm transverse by 8 cm craniocaudal.

Extensor Mechanism: The patella is laterally dislocated and
articulates with the periphery of the lateral femoral condyle.
Thickening and intermediate increased T2 signal are seen in the
superior 4 cm of the patellar tendon consistent with strain. The
vastus medialis is unremarkable. The medial retinacular and MPFL
attachments to the femur and patella appear intact. The mid
substance of the MPFL is markedly attenuated and difficult to
visualize. The lateral retinaculum and quadriceps tendon are intact.
TT-TG distance is 1.6 cm. Shallow trochlear groove and diminutive
medial patellar facet also noted.

Bones:  No fracture or worrisome lesion.

Other: None.
IMPRESSION: The patella is laterally dislocated. Intrasubstance increased T2
signal in the superior 4 cm of the patellar tendon is consistent
with secondary strain. The femoral and patellar attachments of the
medial retinaculum and MPFL appear intact but the mid substance of
the MPFL is attenuated and likely torn. TT-TG distance of 1.6 cm,
diminutive medial patellar facet and shallow trochlear groove
predispose to dislocation.

Negative for meniscal or cruciate or collateral ligament tear.

Thinning and irregularity of cartilage on the lateral tibial
plateau.

Baker's cyst.
